# Patient Record
Sex: Female | Born: 1975 | Race: Black or African American | Hispanic: No | Marital: Single | Smoking: Never smoker
Health system: Southern US, Community
[De-identification: ages and names within clinical notes are randomized; demographics above are authoritative.]

## PROBLEM LIST (undated history)

## (undated) DIAGNOSIS — R112 Nausea with vomiting, unspecified: Secondary | ICD-10-CM

## (undated) DIAGNOSIS — T7840XA Allergy, unspecified, initial encounter: Secondary | ICD-10-CM

## (undated) DIAGNOSIS — E079 Disorder of thyroid, unspecified: Secondary | ICD-10-CM

## (undated) DIAGNOSIS — R7303 Prediabetes: Secondary | ICD-10-CM

## (undated) DIAGNOSIS — I10 Essential (primary) hypertension: Secondary | ICD-10-CM

## (undated) DIAGNOSIS — Z9889 Other specified postprocedural states: Secondary | ICD-10-CM

## (undated) HISTORY — DX: Disorder of thyroid, unspecified: E07.9

## (undated) HISTORY — DX: Prediabetes: R73.03

## (undated) HISTORY — DX: Essential (primary) hypertension: I10

## (undated) HISTORY — PX: TONSILLECTOMY: SUR1361

## (undated) HISTORY — PX: BUNIONECTOMY: SHX129

## (undated) HISTORY — DX: Allergy, unspecified, initial encounter: T78.40XA

---

## 2004-10-29 ENCOUNTER — Ambulatory Visit: Payer: Self-pay | Admitting: Gastroenterology

## 2004-11-06 ENCOUNTER — Ambulatory Visit: Payer: Self-pay | Admitting: Gastroenterology

## 2006-02-24 ENCOUNTER — Emergency Department: Payer: Self-pay | Admitting: Emergency Medicine

## 2008-07-20 ENCOUNTER — Emergency Department: Payer: Self-pay | Admitting: Emergency Medicine

## 2008-09-25 IMAGING — CT CT STONE STUDY
1 of 2 series · 16 of 32 positions shown, 20 images · non-contrast
Comparison: none

REASON FOR EXAM: RLQ abdominal pain: please evaluate for stone,
appendicitis
COMMENTS:  LMP: > one month ago

PROCEDURE:     CT  - CT ABDOMEN /PELVIS WO (STONE)  - February 24, 2006  [DATE]
RESULT:
HISTORY: Abdominal pain.

[Series 2: stone · axial · 0.74mm/px · z∈[-528,-118]mm · 16 of 154 slices shown, 20 images]
[im 11/154  soft-tissue]
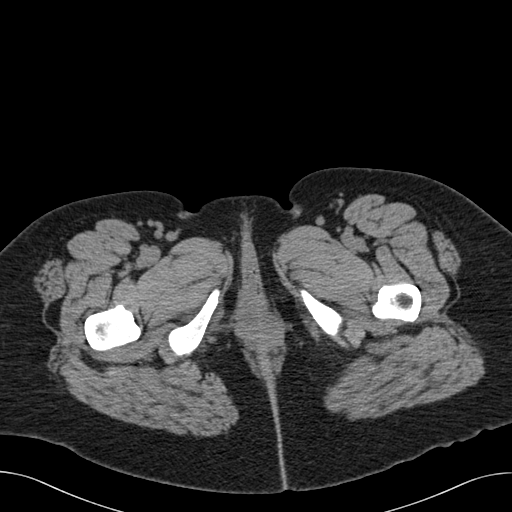
[im 11/154  bone]
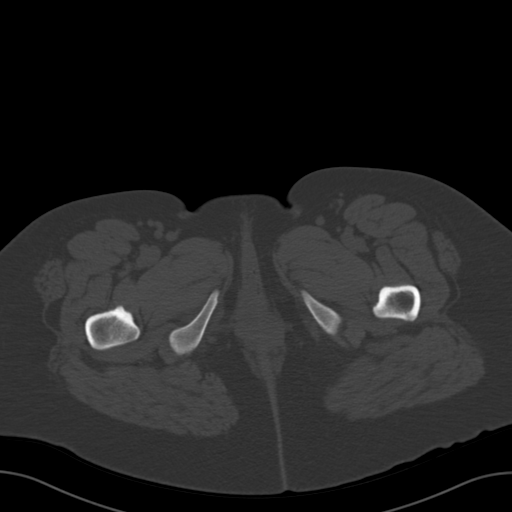
[im 22/154  soft-tissue]
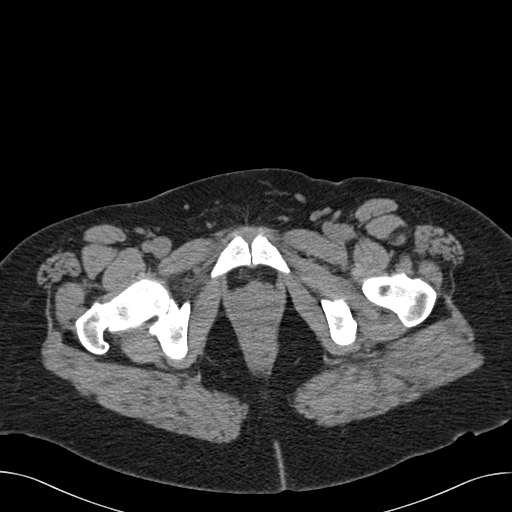
[im 32/154  soft-tissue]
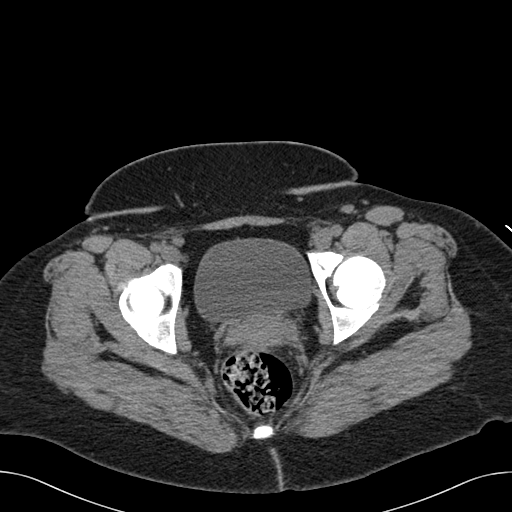
[im 43/154  soft-tissue]
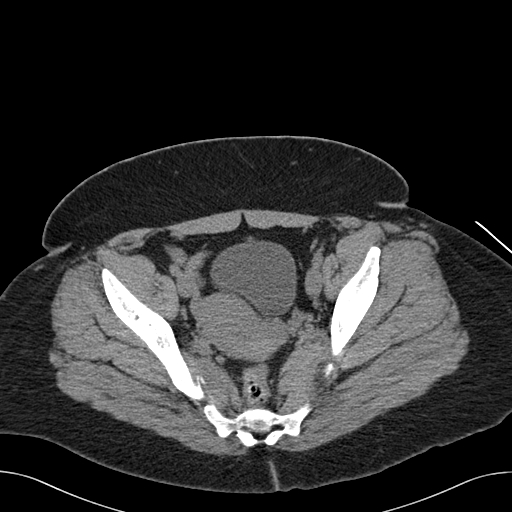
[im 53/154  soft-tissue]
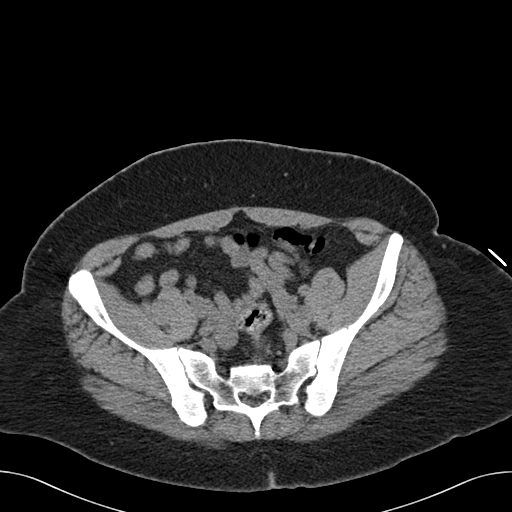
[im 64/154  soft-tissue]
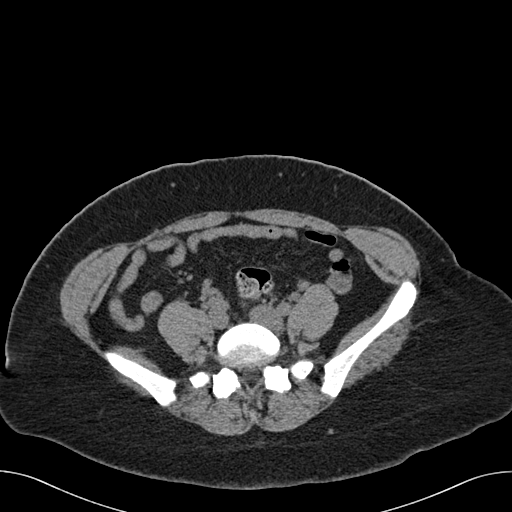
[im 74/154  soft-tissue]
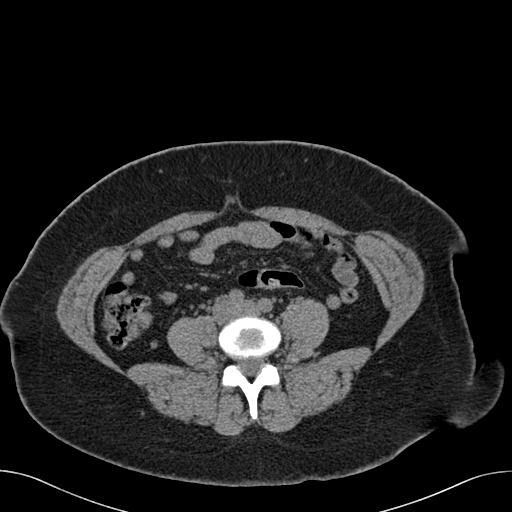
[im 85/154  soft-tissue]
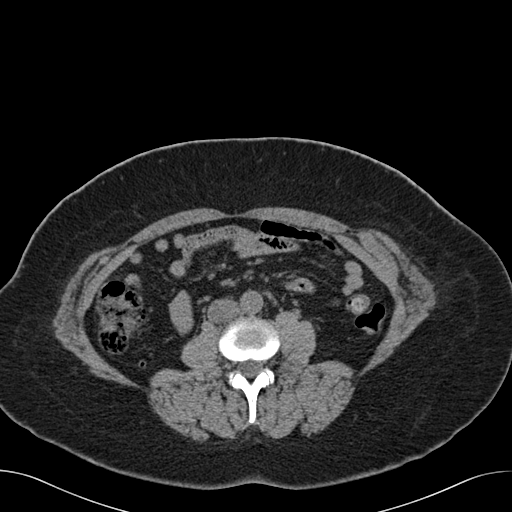
[im 95/154  soft-tissue]
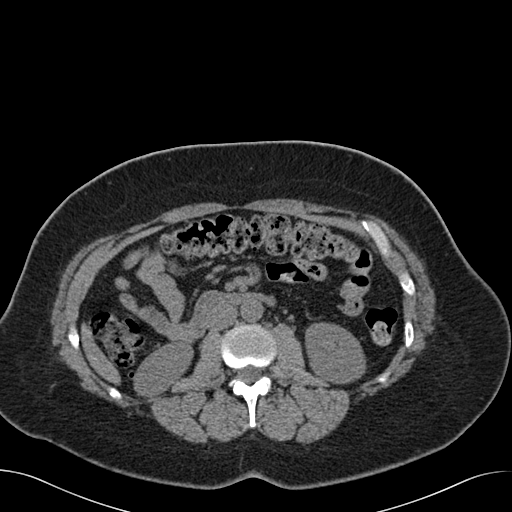
[im 95/154  bone]
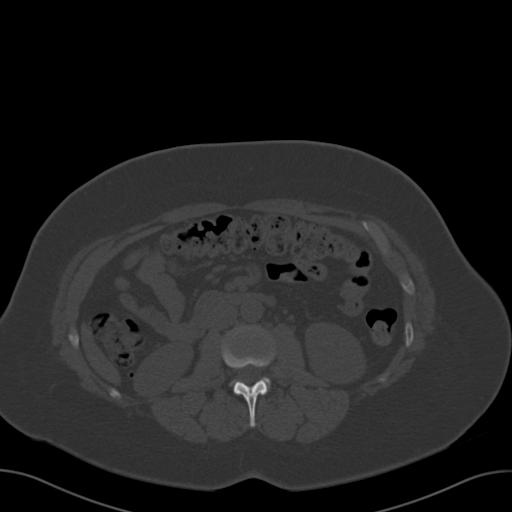
[im 106/154  soft-tissue]
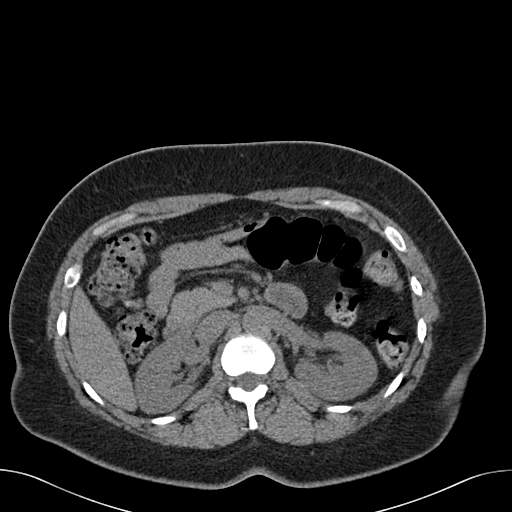
[im 117/154  soft-tissue]
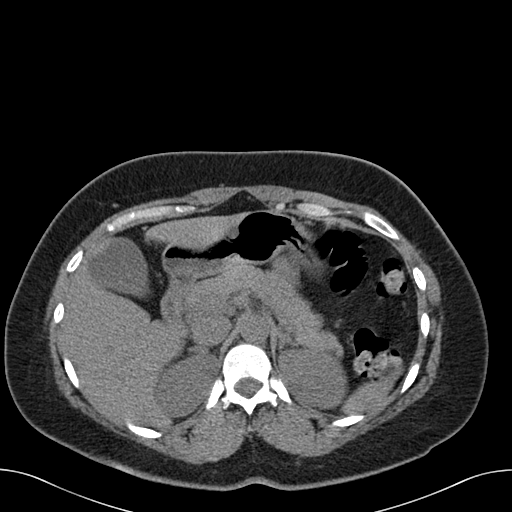
[im 127/154  soft-tissue]
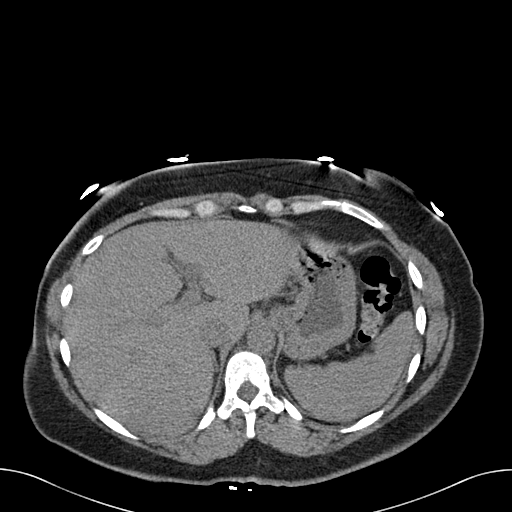
[im 132/154  lung]
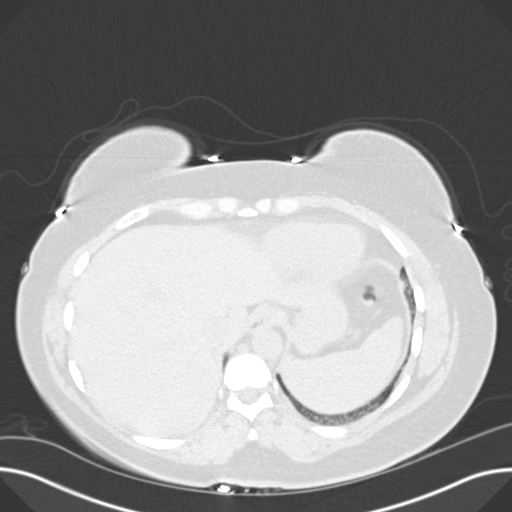
[im 138/154  soft-tissue]
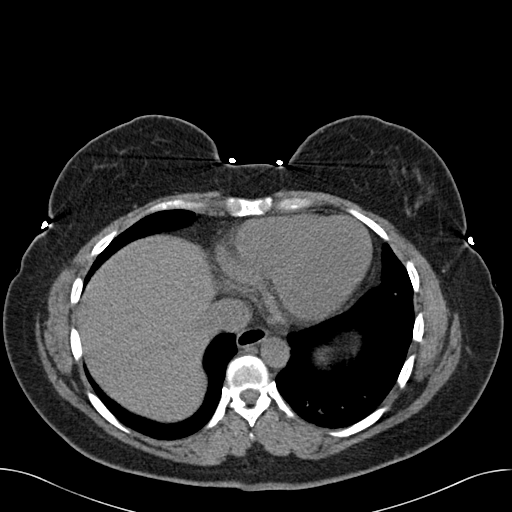
[im 138/154  lung]
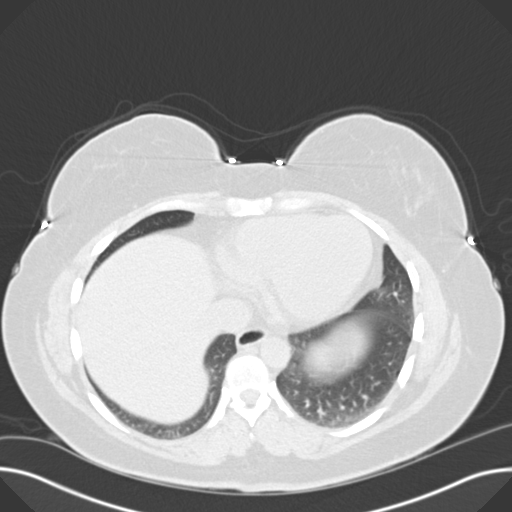
[im 143/154  lung]
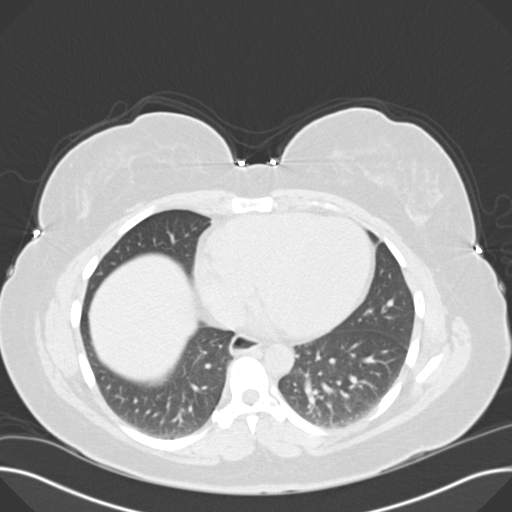
[im 148/154  soft-tissue]
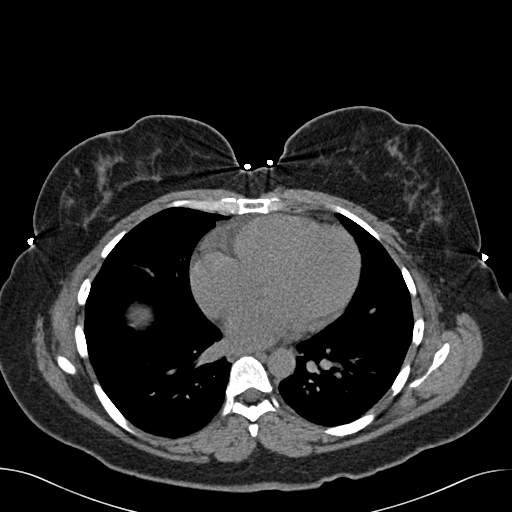
[im 148/154  lung]
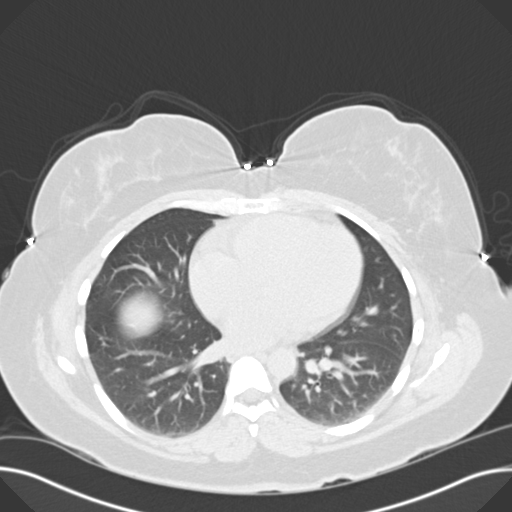

[16 of 32 positions shown; findings below may reference images not displayed]

COMPARISON STUDIES: No recent.

PROCEDURE AND FINDINGS: The liver and spleen are normal. The pancreas is
normal. The gallbladder is non-distended. The adrenals and kidneys are
unremarkable. There is no bowel distention. The appendix is normal. No
inguinal adenopathy is noted.  The lung bases are clear.
IMPRESSION: 1)Nonspecific exam.  Specifically, there is no evidence of hydronephrosis,
ureteral stone or appendicitis. The abdominal aorta is normal.

This report was phoned to the Emergency Room physician at the time of the
study.

## 2012-09-06 ENCOUNTER — Ambulatory Visit: Payer: Self-pay | Admitting: Physician Assistant

## 2013-12-03 HISTORY — PX: LAPAROSCOPIC GASTRIC SLEEVE RESECTION: SHX5895

## 2014-04-15 ENCOUNTER — Encounter: Payer: Self-pay | Admitting: Obstetrics & Gynecology

## 2014-04-15 ENCOUNTER — Ambulatory Visit (INDEPENDENT_AMBULATORY_CARE_PROVIDER_SITE_OTHER): Payer: BLUE CROSS/BLUE SHIELD | Admitting: Obstetrics & Gynecology

## 2014-04-15 VITALS — BP 147/79 | HR 62 | Ht 61.0 in | Wt 175.5 lb

## 2014-04-15 DIAGNOSIS — Z124 Encounter for screening for malignant neoplasm of cervix: Secondary | ICD-10-CM | POA: Diagnosis not present

## 2014-04-15 DIAGNOSIS — Z01419 Encounter for gynecological examination (general) (routine) without abnormal findings: Secondary | ICD-10-CM | POA: Diagnosis not present

## 2014-04-15 DIAGNOSIS — Z1151 Encounter for screening for human papillomavirus (HPV): Secondary | ICD-10-CM | POA: Diagnosis not present

## 2014-04-15 DIAGNOSIS — N946 Dysmenorrhea, unspecified: Secondary | ICD-10-CM | POA: Diagnosis not present

## 2014-04-15 DIAGNOSIS — Z0000 Encounter for general adult medical examination without abnormal findings: Secondary | ICD-10-CM

## 2014-04-15 LAB — CBC
HCT: 40.3 % (ref 36.0–46.0)
Hemoglobin: 12.1 g/dL (ref 12.0–15.0)
MCH: 21.7 pg — AB (ref 26.0–34.0)
MCHC: 30 g/dL (ref 30.0–36.0)
MCV: 72.4 fL — ABNORMAL LOW (ref 78.0–100.0)
Platelets: 245 10*3/uL (ref 150–400)
RBC: 5.57 MIL/uL — ABNORMAL HIGH (ref 3.87–5.11)
RDW: 17.4 % — ABNORMAL HIGH (ref 11.5–15.5)
WBC: 4.6 10*3/uL (ref 4.0–10.5)

## 2014-04-15 NOTE — Progress Notes (Signed)
Here today for gyn physical/ pap smear.  Would like to discuss BC options.

## 2014-04-15 NOTE — Addendum Note (Signed)
Addended by: Tandy GawHINTON, Monet North C on: 04/15/2014 11:06 AM   Modules accepted: Orders

## 2014-04-15 NOTE — Progress Notes (Signed)
Subjective:    Alicia Vincent is a 39 y.o. S AA P1 72(41 yo daughterGilford Rile) female who presents for an annual exam. The patient has no complaints today. She complains a mole that she recently noticed on her left vulva.The patient is not currently sexually active. GYN screening history: last pap: was normal. The patient wears seatbelts: yes. The patient participates in regular exercise: yes. Has the patient ever been transfused or tattooed?: yes. The patient reports that there is not domestic violence in her life.   Menstrual History: OB History    Gravida Para Term Preterm AB TAB SAB Ectopic Multiple Living   1 1              Menarche age: [age]  Patient's last menstrual period was 04/13/2014.    The following portions of the patient's history were reviewed and updated as appropriate: allergies, current medications, past family history, past medical history, past social history, past surgical history and problem list.  Review of Systems A comprehensive review of systems was negative. No mammogram ever, Pap smear is due, Periods are monthly and last about 5 days, painful and heavy. She denies dyspareunia. 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 She declines STI screening. She is interested in a BTL.    Objective:    BP 147/79 mmHg  Pulse 62  Ht 5\' 1"  (1.549 m)  Wt 175 lb 8 oz (79.606 kg)  BMI 33.18 kg/m2  LMP 04/13/2014  General Appearance:    Alert, cooperative, no distress, appears stated age  Head:    Normocephalic, without obvious abnormality, atraumatic  Eyes:    PERRL, conjunctiva/corneas clear, EOM's intact, fundi    benign, both eyes  Ears:    Normal TM's and external ear canals, both ears  Nose:   Nares normal, septum midline, mucosa normal, no drainage    or sinus tenderness  Throat:   Lips, mucosa, and tongue normal; teeth and gums normal  Neck:   Supple, symmetrical, trachea midline, no adenopathy;    thyroid:  no enlargement/tenderness/nodules; no carotid   bruit  or JVD  Back:     Symmetric, no curvature, ROM normal, no CVA tenderness  Lungs:     Clear to auscultation bilaterally, respirations unlabored  Chest Wall:    No tenderness or deformity   Heart:    Regular rate and rhythm, S1 and S2 normal, no murmur, rub   or gallop  Breast Exam:    No tenderness, masses, or nipple abnormality  Abdomen:     Soft, non-tender, bowel sounds active all four quadrants,    no masses, no organomegaly  Genitalia:    Normal female without lesion, discharge or tenderness, skin tag on left labia majora, ULN size, AV, NT, mobile, adnexa not palpable     Extremities:   Extremities normal, atraumatic, no cyanosis or edema  Pulses:   2+ and symmetric all extremities  Skin:   Skin color, texture, turgor normal, no rashes or lesions  Lymph nodes:   Cervical, supraclavicular, and axillary nodes normal  Neurologic:   CNII-XII intact, normal strength, sensation and reflexes    throughout  .    Assessment:    Healthy female exam.   dysmenorrhea   Plan:     Breast self exam technique reviewed and patient encouraged to perform self-exam monthly. Mammogram. Thin prep Pap smear. with cotesting Schedule gyn u/s Consider Essure for contraception

## 2014-04-19 LAB — CYTOLOGY - PAP

## 2014-04-22 ENCOUNTER — Ambulatory Visit (HOSPITAL_COMMUNITY)
Admission: RE | Admit: 2014-04-22 | Discharge: 2014-04-22 | Disposition: A | Discharge status: Home / Self Care | Payer: BLUE CROSS/BLUE SHIELD | Source: Ambulatory Visit | Attending: Obstetrics & Gynecology | Admitting: Obstetrics & Gynecology

## 2014-04-22 DIAGNOSIS — N946 Dysmenorrhea, unspecified: Secondary | ICD-10-CM

## 2014-04-29 ENCOUNTER — Ambulatory Visit: Payer: BLUE CROSS/BLUE SHIELD | Admitting: Obstetrics & Gynecology

## 2014-05-06 ENCOUNTER — Ambulatory Visit (HOSPITAL_COMMUNITY)
Admission: RE | Admit: 2014-05-06 | Discharge: 2014-05-06 | Disposition: A | Discharge status: Home / Self Care | Payer: BLUE CROSS/BLUE SHIELD | Source: Ambulatory Visit | Attending: Obstetrics & Gynecology | Admitting: Obstetrics & Gynecology

## 2014-05-06 ENCOUNTER — Other Ambulatory Visit: Payer: Self-pay | Admitting: Obstetrics & Gynecology

## 2014-05-06 DIAGNOSIS — Z1231 Encounter for screening mammogram for malignant neoplasm of breast: Secondary | ICD-10-CM | POA: Diagnosis not present

## 2014-05-06 DIAGNOSIS — Z0000 Encounter for general adult medical examination without abnormal findings: Secondary | ICD-10-CM

## 2014-05-09 ENCOUNTER — Other Ambulatory Visit: Payer: Self-pay | Admitting: Obstetrics & Gynecology

## 2014-05-09 DIAGNOSIS — R928 Other abnormal and inconclusive findings on diagnostic imaging of breast: Secondary | ICD-10-CM

## 2014-05-10 ENCOUNTER — Encounter: Payer: Self-pay | Admitting: Obstetrics & Gynecology

## 2014-05-10 ENCOUNTER — Ambulatory Visit (INDEPENDENT_AMBULATORY_CARE_PROVIDER_SITE_OTHER): Payer: BLUE CROSS/BLUE SHIELD | Admitting: Obstetrics & Gynecology

## 2014-05-10 VITALS — BP 142/86 | HR 58 | Ht 61.0 in | Wt 170.0 lb

## 2014-05-10 DIAGNOSIS — Z30011 Encounter for initial prescription of contraceptive pills: Secondary | ICD-10-CM

## 2014-05-10 MED ORDER — NORGESTREL-ETHINYL ESTRADIOL 0.3-30 MG-MCG PO TABS
1.0000 | ORAL_TABLET | Freq: Every day | ORAL | Status: DC
Start: 2014-05-10 — End: 2015-10-10

## 2014-05-10 NOTE — Progress Notes (Signed)
   Subjective:    Patient ID: Alicia Vincent, female    DOB: 09/19/1975, 39 y.o.   MRN: [id number]  HPI  39 yo S AA P1 69(41 yo daughter) here today to discuss contraception. She has a new partner and wants reliable contraception. She was interested in the Essure however, after researching it online with me she prefers to use OCPs and then consider Mirena in the future. She has used OCPs without problem in the past.  Review of Systems     Objective:   Physical Exam WNWH NAD Breathing and ambulating normally abd- obese, benign       Assessment & Plan:  Contraception- start generic lo ovral with NMP. USE BACKUP method for a month. RTC 2 months for BP check Encourage weight loss

## 2014-05-12 ENCOUNTER — Other Ambulatory Visit: Payer: Self-pay | Admitting: Obstetrics & Gynecology

## 2014-05-12 ENCOUNTER — Other Ambulatory Visit: Payer: Self-pay

## 2014-05-12 DIAGNOSIS — R928 Other abnormal and inconclusive findings on diagnostic imaging of breast: Secondary | ICD-10-CM

## 2014-05-13 ENCOUNTER — Ambulatory Visit
Admission: RE | Admit: 2014-05-13 | Discharge: 2014-05-13 | Disposition: A | Discharge status: Home / Self Care | Payer: BLUE CROSS/BLUE SHIELD | Source: Ambulatory Visit | Attending: Obstetrics & Gynecology | Admitting: Obstetrics & Gynecology

## 2014-05-13 DIAGNOSIS — R928 Other abnormal and inconclusive findings on diagnostic imaging of breast: Secondary | ICD-10-CM

## 2014-10-14 ENCOUNTER — Other Ambulatory Visit (HOSPITAL_COMMUNITY): Payer: Self-pay | Admitting: Obstetrics & Gynecology

## 2014-10-14 DIAGNOSIS — N6320 Unspecified lump in the left breast, unspecified quadrant: Secondary | ICD-10-CM

## 2014-11-11 ENCOUNTER — Other Ambulatory Visit (HOSPITAL_COMMUNITY): Payer: Self-pay | Admitting: Obstetrics & Gynecology

## 2014-11-11 ENCOUNTER — Ambulatory Visit
Admission: RE | Admit: 2014-11-11 | Discharge: 2014-11-11 | Disposition: A | Discharge status: Home / Self Care | Payer: BLUE CROSS/BLUE SHIELD | Source: Ambulatory Visit | Attending: Obstetrics & Gynecology | Admitting: Obstetrics & Gynecology

## 2014-11-11 DIAGNOSIS — N6320 Unspecified lump in the left breast, unspecified quadrant: Secondary | ICD-10-CM

## 2015-04-06 ENCOUNTER — Other Ambulatory Visit (HOSPITAL_COMMUNITY): Payer: Self-pay | Admitting: Obstetrics & Gynecology

## 2015-04-06 DIAGNOSIS — N6320 Unspecified lump in the left breast, unspecified quadrant: Secondary | ICD-10-CM

## 2015-05-12 ENCOUNTER — Ambulatory Visit
Admission: RE | Admit: 2015-05-12 | Discharge: 2015-05-12 | Disposition: A | Discharge status: Home / Self Care | Payer: BLUE CROSS/BLUE SHIELD | Source: Ambulatory Visit | Attending: Obstetrics & Gynecology | Admitting: Obstetrics & Gynecology

## 2015-05-12 DIAGNOSIS — N6320 Unspecified lump in the left breast, unspecified quadrant: Secondary | ICD-10-CM

## 2015-07-14 ENCOUNTER — Other Ambulatory Visit: Payer: Self-pay | Admitting: Family Medicine

## 2015-07-14 DIAGNOSIS — N644 Mastodynia: Secondary | ICD-10-CM

## 2015-07-17 ENCOUNTER — Ambulatory Visit
Admission: RE | Admit: 2015-07-17 | Discharge: 2015-07-17 | Disposition: A | Discharge status: Home / Self Care | Payer: BLUE CROSS/BLUE SHIELD | Source: Ambulatory Visit | Attending: Family Medicine | Admitting: Family Medicine

## 2015-07-17 DIAGNOSIS — N644 Mastodynia: Secondary | ICD-10-CM

## 2015-10-10 ENCOUNTER — Ambulatory Visit (INDEPENDENT_AMBULATORY_CARE_PROVIDER_SITE_OTHER): Payer: BLUE CROSS/BLUE SHIELD | Admitting: Obstetrics & Gynecology

## 2015-10-10 ENCOUNTER — Encounter: Payer: Self-pay | Admitting: Obstetrics & Gynecology

## 2015-10-10 VITALS — BP 133/79 | HR 69 | Wt 141.0 lb

## 2015-10-10 DIAGNOSIS — N946 Dysmenorrhea, unspecified: Secondary | ICD-10-CM | POA: Diagnosis not present

## 2015-10-10 DIAGNOSIS — Z23 Encounter for immunization: Secondary | ICD-10-CM

## 2015-10-10 DIAGNOSIS — Z0000 Encounter for general adult medical examination without abnormal findings: Secondary | ICD-10-CM

## 2015-10-10 DIAGNOSIS — Z3042 Encounter for surveillance of injectable contraceptive: Secondary | ICD-10-CM

## 2015-10-10 DIAGNOSIS — N920 Excessive and frequent menstruation with regular cycle: Secondary | ICD-10-CM

## 2015-10-10 MED ORDER — MEDROXYPROGESTERONE ACETATE 150 MG/ML IM SUSP
150.0000 mg | Freq: Once | INTRAMUSCULAR | Status: AC
  Administered 2015-10-10: 150 mg via INTRAMUSCULAR

## 2015-10-10 NOTE — Progress Notes (Signed)
   Subjective:    Patient ID: Alicia RileMinnie D Vincent, female    DOB: 05/24/1975, 40 y.o.   MRN: [id number]  HPI 40 yo S AA 58P1 yo here to discuss her desire to have no periods. She would like to start depo provera. She was prescribed OCPs last year for contraception but is no longer sexually active and she quit taking them after only 1 month. Her periods are heavy and painful. She used depo provera for about a year in her 30s and did not have any periods although she did gain weight.   Review of Systems She had the gastric sleeve surgery about a year ago. She has lost 90# and is still losing a little. Pap normal 4/16 Mammogram 5/17    Objective:   Physical Exam [id number] Breathing, conversing, and ambulating normally Abd- benign     Assessment & Plan:  Desire for amenorrhea/wants to restart depo provera I have cautioned her that this may cause weight gain I have also offered to order u/s to look for cause of her dymenorrhea/heavy periods. Although she initially declined this, she has now agreed. RTC 12 weeks/prn sooner

## 2015-10-11 LAB — CBC
HEMATOCRIT: 42 % (ref 35.0–45.0)
Hemoglobin: 12.7 g/dL (ref 11.7–15.5)
MCH: 22.4 pg — ABNORMAL LOW (ref 27.0–33.0)
MCHC: 30.2 g/dL — ABNORMAL LOW (ref 32.0–36.0)
MCV: 73.9 fL — ABNORMAL LOW (ref 80.0–100.0)
MPV: 11.1 fL (ref 7.5–12.5)
Platelets: 227 10*3/uL (ref 140–400)
RBC: 5.68 MIL/uL — AB (ref 3.80–5.10)
RDW: 16 % — AB (ref 11.0–15.0)
WBC: 5.2 10*3/uL (ref 3.8–10.8)

## 2015-10-20 ENCOUNTER — Ambulatory Visit: Payer: BLUE CROSS/BLUE SHIELD

## 2015-12-26 ENCOUNTER — Ambulatory Visit: Payer: BLUE CROSS/BLUE SHIELD

## 2015-12-29 ENCOUNTER — Ambulatory Visit (INDEPENDENT_AMBULATORY_CARE_PROVIDER_SITE_OTHER): Payer: BLUE CROSS/BLUE SHIELD | Admitting: *Deleted

## 2015-12-29 DIAGNOSIS — Z3042 Encounter for surveillance of injectable contraceptive: Secondary | ICD-10-CM

## 2015-12-29 DIAGNOSIS — N946 Dysmenorrhea, unspecified: Secondary | ICD-10-CM

## 2015-12-29 MED ORDER — MEDROXYPROGESTERONE ACETATE 150 MG/ML IM SUSP
150.0000 mg | INTRAMUSCULAR | Status: DC
  Administered 2015-12-29 – 2016-07-09 (×3): 150 mg via INTRAMUSCULAR

## 2015-12-29 NOTE — Progress Notes (Signed)
Pt here today for Depo Provera 150mg , last injection given on 10/10/15.  Depo Provera 150mg  given.

## 2016-03-15 ENCOUNTER — Ambulatory Visit (INDEPENDENT_AMBULATORY_CARE_PROVIDER_SITE_OTHER): Payer: BLUE CROSS/BLUE SHIELD | Admitting: *Deleted

## 2016-03-15 DIAGNOSIS — Z3042 Encounter for surveillance of injectable contraceptive: Secondary | ICD-10-CM | POA: Diagnosis not present

## 2016-03-15 DIAGNOSIS — N946 Dysmenorrhea, unspecified: Secondary | ICD-10-CM | POA: Diagnosis not present

## 2016-03-15 MED ORDER — MEDROXYPROGESTERONE ACETATE 150 MG/ML IM SUSP: 150.0000 mg | INTRAMUSCULAR | 3 refills | Status: DC

## 2016-03-15 NOTE — Progress Notes (Signed)
Pt is here today for Depo Provera 150mg  injection to help with dysmenorrhea.  Depo Provera given and rx sent to pharmacy for pt to supply at next visit.  Pt acknowledged instructions.

## 2016-06-11 ENCOUNTER — Ambulatory Visit
Admission: EM | Admit: 2016-06-11 | Discharge: 2016-06-11 | Disposition: A | Discharge status: Home / Self Care | Payer: 59 | Attending: Family Medicine | Admitting: Family Medicine

## 2016-06-11 ENCOUNTER — Ambulatory Visit: Payer: BLUE CROSS/BLUE SHIELD

## 2016-06-11 DIAGNOSIS — M26621 Arthralgia of right temporomandibular joint: Secondary | ICD-10-CM

## 2016-06-11 DIAGNOSIS — R609 Edema, unspecified: Secondary | ICD-10-CM | POA: Diagnosis not present

## 2016-06-11 DIAGNOSIS — I10 Essential (primary) hypertension: Secondary | ICD-10-CM

## 2016-06-11 DIAGNOSIS — Z8261 Family history of arthritis: Secondary | ICD-10-CM

## 2016-06-11 DIAGNOSIS — M79641 Pain in right hand: Secondary | ICD-10-CM | POA: Diagnosis not present

## 2016-06-11 MED ORDER — HYDROCHLOROTHIAZIDE 25 MG PO TABS: 25.0000 mg | ORAL_TABLET | Freq: Every day | ORAL | Status: DC

## 2016-06-11 NOTE — ED Triage Notes (Signed)
Patient complains of right hand swelling worse in the morning. Patient also reports bilateral feet swelling. Patient reports bilateral jaw pain that is worse with chewing.

## 2016-06-11 NOTE — Discharge Instructions (Signed)
For today Wednesday and Friday recommend taking extra hydrochlorothiazide pill also please take with or issues or eating a banana on those days as well

## 2016-06-11 NOTE — ED Provider Notes (Signed)
[facility]    CSN: [id number] Arrival date & time: 06/11/16  0818     History   Chief Complaint Chief Complaint  Patient presents with  . Hand Pain    right  . Leg Swelling    Bilateral  . Jaw Pain    Bilateral    HPI Alicia Vincent is a 41 y.o. female.   Patient's here because several things #1 was hand pain started yesterday which woke up she also notes swelling in both legs. She has had some right jaw pain as well. Swelling in the leg though was worse than the left foot than the right. The hand pain also is in both hands was worse in the right versus the left. If further discuss with patient this is strong history of rheumatoid arthritis in the family. According to her she has several family members brother mother and other close family members who've had rheumatoid arthritis. She doesn't been diagnosed rheumatoid arthritis. She was concerned and came in to be seen and evaluated. She also has history hypertension she is taking hydrochlorothiazide prescribed by her doctor but she states that her doctors at the area. She only has a few more pills left. She also reports being on hormonal therapy for birth control.   The history is provided by the patient. No language interpreter was used.  Hand Pain  This is a new problem. The current episode started yesterday. The problem has not changed since onset.Pertinent negatives include no chest pain, no abdominal pain, no headaches and no shortness of breath. Nothing aggravates the symptoms. She has tried nothing for the symptoms. The treatment provided no relief.    Past Medical History:  Diagnosis Date  . Allergy   . Hypertension   . Pre-diabetes   . Thyroid disease     There are no active problems to display for this patient.   Past Surgical History:  Procedure Laterality Date  . BUNIONECTOMY    . LAPAROSCOPIC GASTRIC SLEEVE RESECTION  12/03/13    OB History    Gravida Para Term Preterm AB Living   1 1            SAB TAB Ectopic Multiple Live Births                   Home Medications    Prior to Admission medications   Medication Sig Start Date End Date Taking? Authorizing Provider  docusate sodium (COLACE) 100 MG capsule Take 100 mg by mouth. 12/21/13  Yes [provider]  EPINEPHrine 0.3 mg/0.3 mL IJ SOAJ injection Frequency:PHARMDIR   Dosage:0.0     Instructions:  Note:Inject into lateral thigh according to package instructions as needed for severe allergic reactions, then go immediately to the nearest Emergency Department Dose: 0.3MG /0.3 11/29/11  Yes [provider]  hydrochlorothiazide (HYDRODIURIL) 25 MG tablet Take 25 mg by mouth daily.   Yes [provider]  medroxyPROGESTERone (DEPO-PROVERA) 150 MG/ML injection Inject 1 mL (150 mg total) into the muscle every 3 (three) months. 03/15/16  Yes Dove, Myra C, MD  fluticasone (FLONASE) 50 MCG/ACT nasal spray 2 sprays in each nostril daily when needed. 05/17/10   [provider]  hydrochlorothiazide (HYDRODIURIL) 25 MG tablet Take 1 tablet (25 mg total) by mouth daily. 06/11/16   Hassan RowanWade, Rik Wadel, MD  omeprazole (PRILOSEC) 10 MG capsule Take 10 mg by mouth daily.    [provider]    Family History Family History  Problem Relation Age  of Onset  . Hypertension Mother   . Diabetes Mother   . Thyroid disease Mother   . Heart disease Mother   . Hypertension Father     Social History Social History  Substance Use Topics  . Smoking status: Never Smoker  . Smokeless tobacco: Never Used  . Alcohol use No     Allergies   Shellfish-derived products   Review of Systems Review of Systems  Constitutional: Negative for activity change and appetite change.  Respiratory: Negative for shortness of breath.   Cardiovascular: Negative for chest pain.  Gastrointestinal: Negative for abdominal pain.  Musculoskeletal: Positive for joint swelling and myalgias.  Skin: Negative for color change, pallor and  rash.  Neurological: Negative for headaches.  All other systems reviewed and are negative.    Physical Exam Triage Vital Signs ED Triage Vitals  Enc Vitals Group     BP 06/11/16 0846 (!) 161/81     Pulse Rate 06/11/16 0846 63     Resp 06/11/16 0846 18     Temp 06/11/16 0846 98.4 F (36.9 C)     Temp Source 06/11/16 0846 Oral     SpO2 06/11/16 0846 100 %     Weight 06/11/16 0844 138 lb (62.6 kg)     Height 06/11/16 0844 5' (1.524 m)     Head Circumference --      Peak Flow --      Pain Score 06/11/16 0844 7     Pain Loc --      Pain Edu? --      Excl. in GC? --    No data found.   Updated Vital Signs BP (!) 161/81 (BP Location: Left Arm)   Pulse 63   Temp 98.4 F (36.9 C) (Oral)   Resp 18   Ht 5' (1.524 m)   Wt 138 lb (62.6 kg)   SpO2 100%   BMI 26.95 kg/m   Visual Acuity Right Eye Distance:   Left Eye Distance:   Bilateral Distance:    Right Eye Near:   Left Eye Near:    Bilateral Near:     Physical Exam  Constitutional: She is oriented to person, place, and time. She appears well-developed and well-nourished.  HENT:  Head: Normocephalic and atraumatic. Head is without contusion. Hair is normal.    Right Ear: External ear normal.  Tenderness over the right jaw which could be early TMJ syndrome her right hand is mildly swollen and tender to palpation no arthritic change are present  Eyes: Pupils are equal, round, and reactive to light.  Pulmonary/Chest: Effort normal.  Musculoskeletal: She exhibits edema.       Right hand: She exhibits decreased range of motion and tenderness.       Hands:      Right foot: There is swelling.       Left foot: There is tenderness and swelling.       Feet:  Neurological: She is alert and oriented to person, place, and time.  Skin: Skin is warm and dry.  Psychiatric: She has a normal mood and affect.  Vitals reviewed.    UC Treatments / Results  Labs (all labs ordered are listed, but only abnormal results are  displayed) Labs Reviewed - No data to display  EKG  EKG Interpretation None       Radiology No results found.  Procedures Procedures (including critical care time)  Medications Ordered in UC Medications - No data to display   Initial  Impression / Assessment and Plan / UC Course  I have reviewed the triage vital signs and the nursing notes.  Pertinent labs & imaging results that were available during my care of the patient were reviewed by me and considered in my medical decision making (see chart for details).    Explained patient that I really think that she is on the need to follow-up for PCP to get rheumatoid work done sedimentation rate and be followed. I don't think there is evidence of arthritis now but this could definitely felt family history. I am unable to prescribe an anti-inflammatory like Mobic she normally would do since she's had gastric bypass surgery. I would recommend Tylenol for discomfort for the increased swelling on Tuesday Wednesday and Friday this week taking extra hydrochlorothiazide pill and drink or sutures or your banana when she takes the extra pill. I will give her a 30 day prescription with one refill until she can find a PCP here and ID give her Doctor Plonk name to call for an appointment. Work note given for today as well.   Final Clinical Impressions(s) / UC Diagnoses   Final diagnoses:  Peripheral edema  Right hand pain  TMJ tenderness, right  Family history of rheumatoid arthritis  Essential hypertension    New Prescriptions Discharge Medication List as of 06/11/2016  9:30 AM    START taking these medications   Details  !! hydrochlorothiazide (HYDRODIURIL) 25 MG tablet Take 1 tablet (25 mg total) by mouth daily., Starting Tue 06/11/2016, Normal     !! - Potential duplicate medications found. Please discuss with provider.       Note: This dictation was prepared with Dragon dictation along with smaller phrase technology. Any  transcriptional errors that result from this process are unintentional.   Hassan Rowan, MD 06/11/16 1004

## 2016-07-09 ENCOUNTER — Ambulatory Visit (INDEPENDENT_AMBULATORY_CARE_PROVIDER_SITE_OTHER): Payer: 59 | Admitting: *Deleted

## 2016-07-09 DIAGNOSIS — Z3042 Encounter for surveillance of injectable contraceptive: Secondary | ICD-10-CM | POA: Diagnosis not present

## 2016-07-09 DIAGNOSIS — N946 Dysmenorrhea, unspecified: Secondary | ICD-10-CM

## 2016-07-09 NOTE — Progress Notes (Signed)
Pt is here today for Depo Provera 150 mg injection to help with dysmenorrhea.  Last injection was given on 03-15-16.  Pt is currently practicing abstinence and has not been sexually active in over 6 months. Depo Provera 150mg  given.

## 2016-09-24 ENCOUNTER — Ambulatory Visit: Payer: BLUE CROSS/BLUE SHIELD

## 2016-11-21 IMAGING — US US TRANSVAGINAL NON-OB
1 series · 14 of 25 positions shown · non-contrast
Comparison: None

CLINICAL DATA: Dysmenorrhea



[Series 1: us pelvis complete · 14 of 62 slices shown]
[im 1/62]
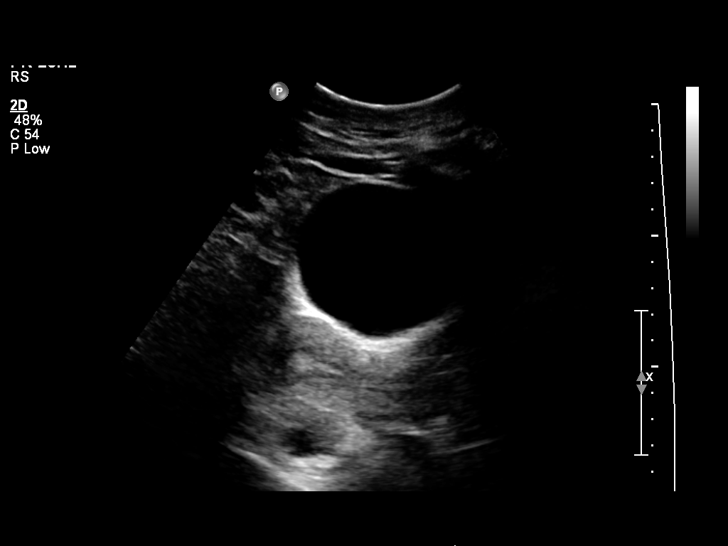
[im 6/62]
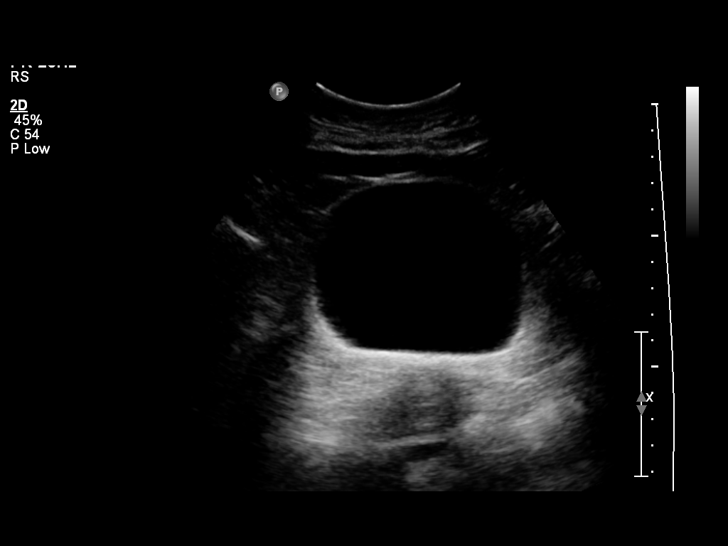
[im 11/62]
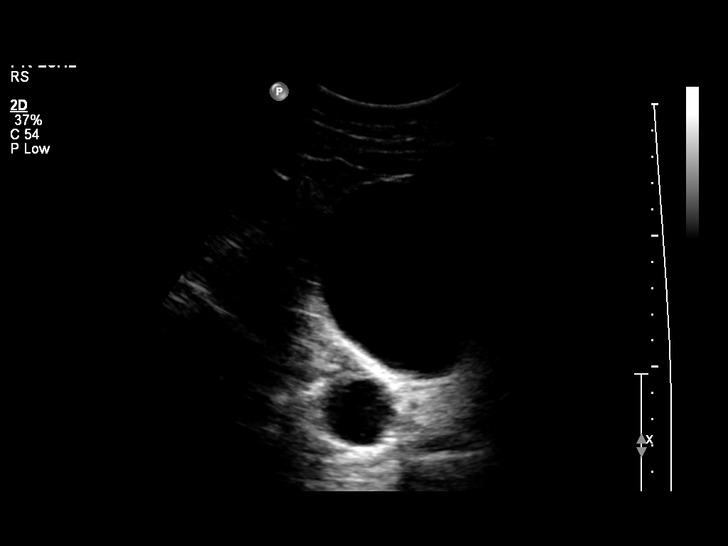
[im 16/62]
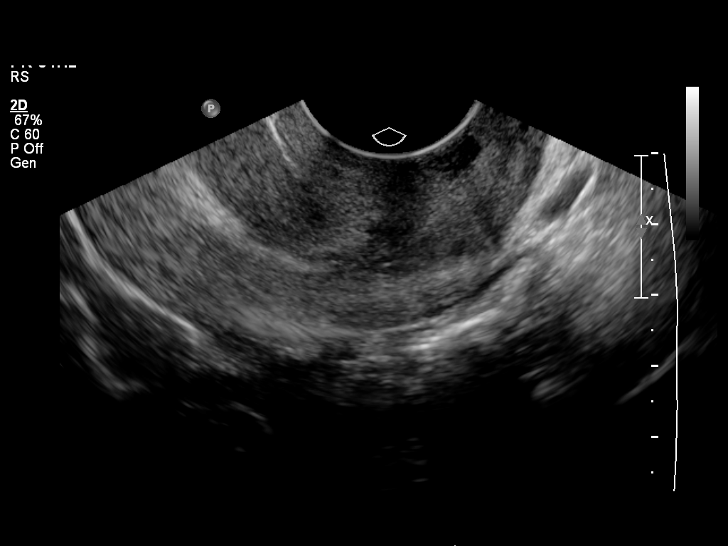
[im 21/62]
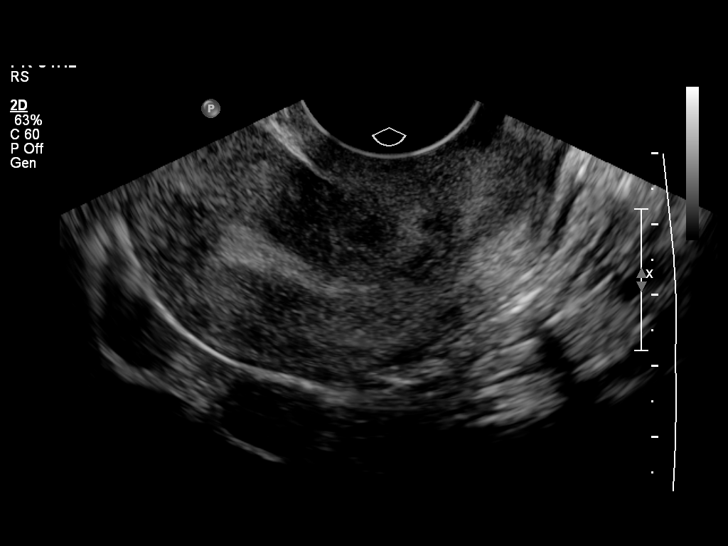
[im 23/62]
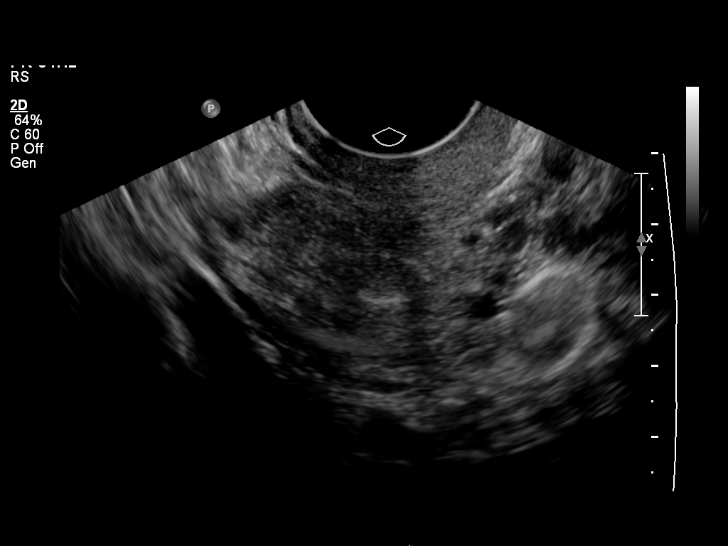
[im 28/62]
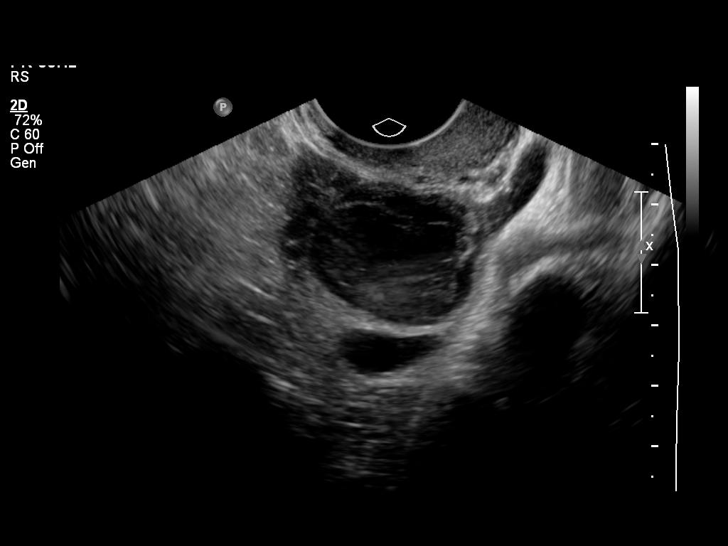
[im 34/62]
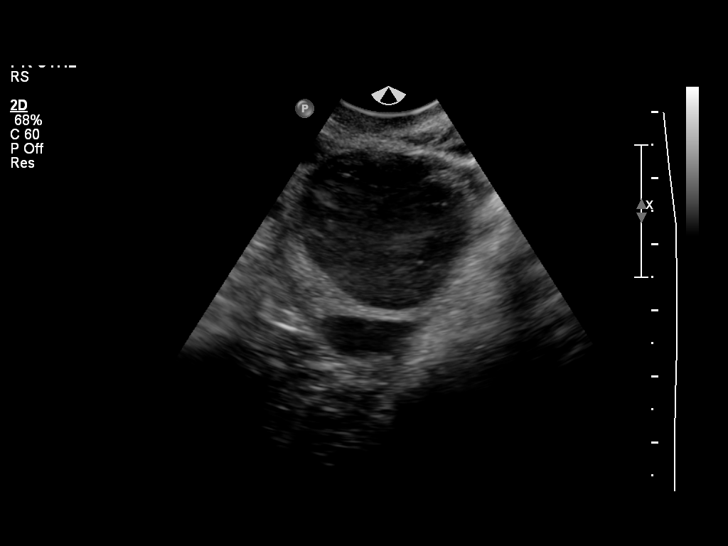
[im 39/62]
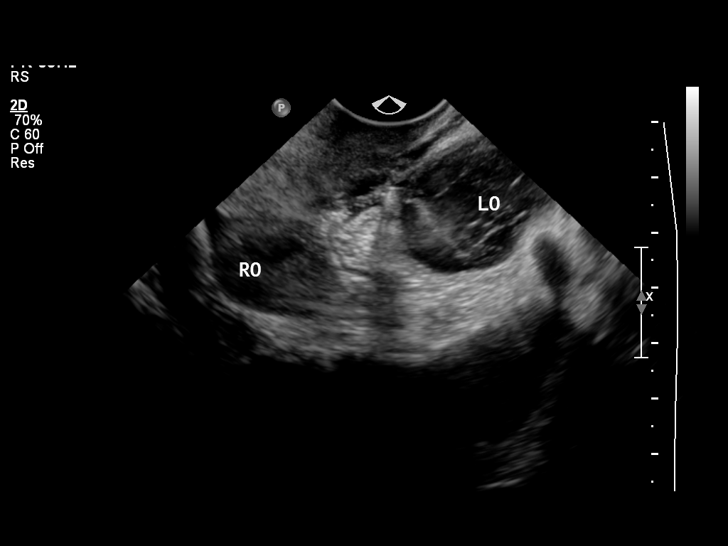
[im 41/62]
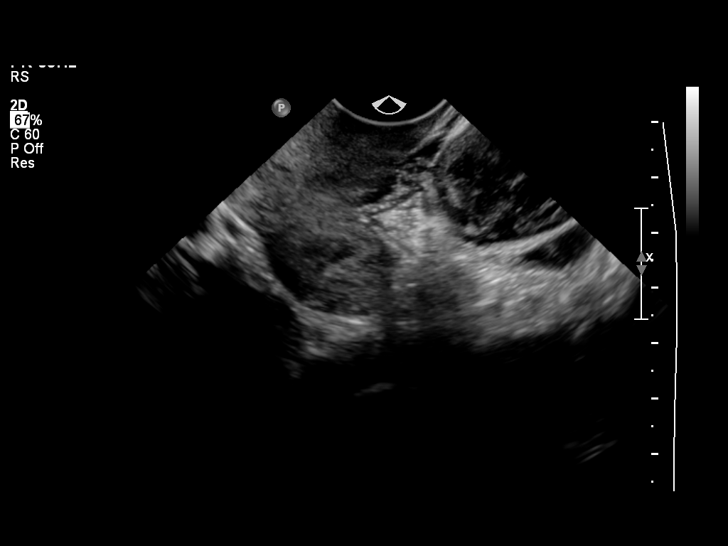
[im 46/62]
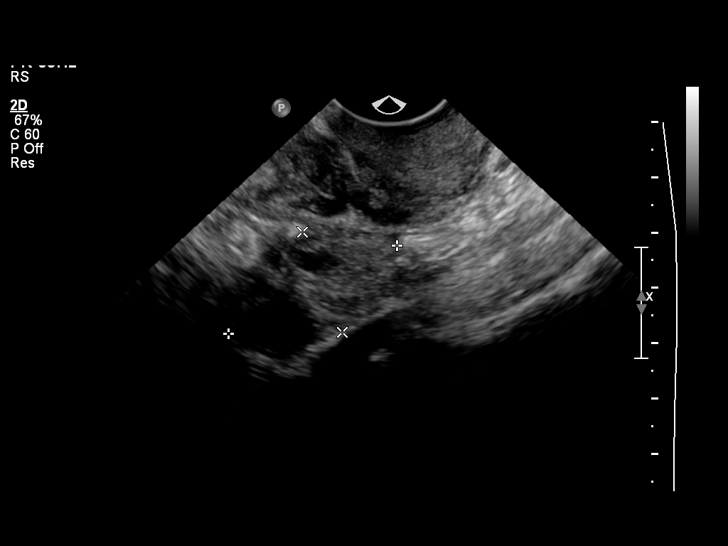
[im 51/62]
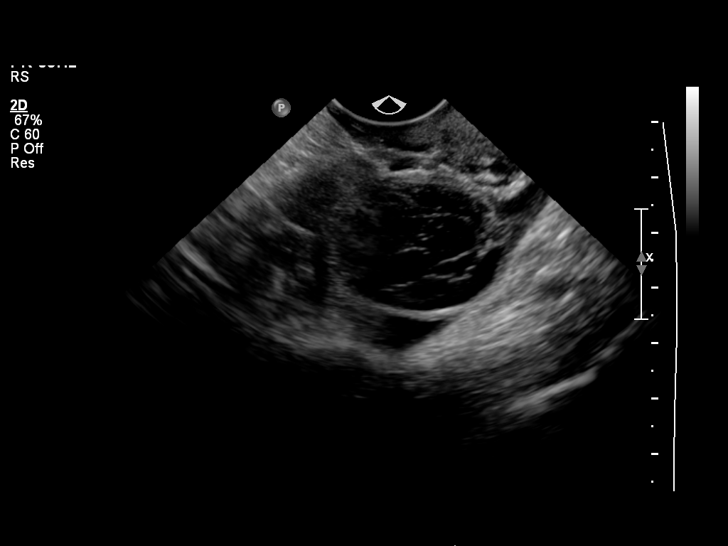
[im 56/62]
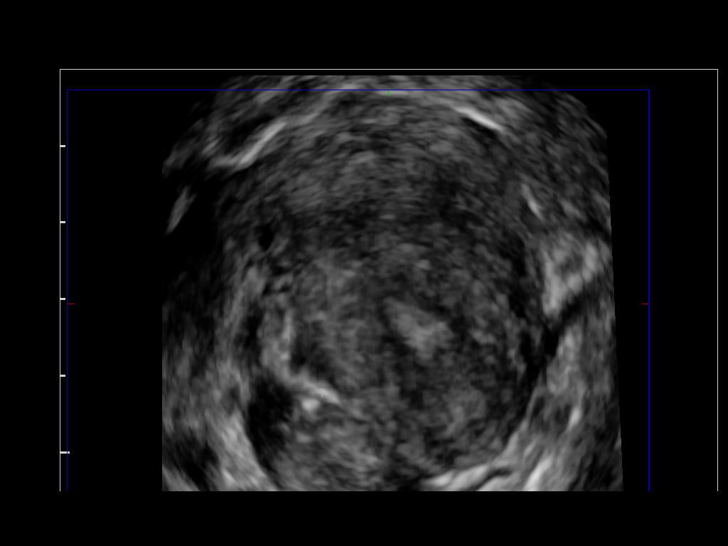
[im 62/62]
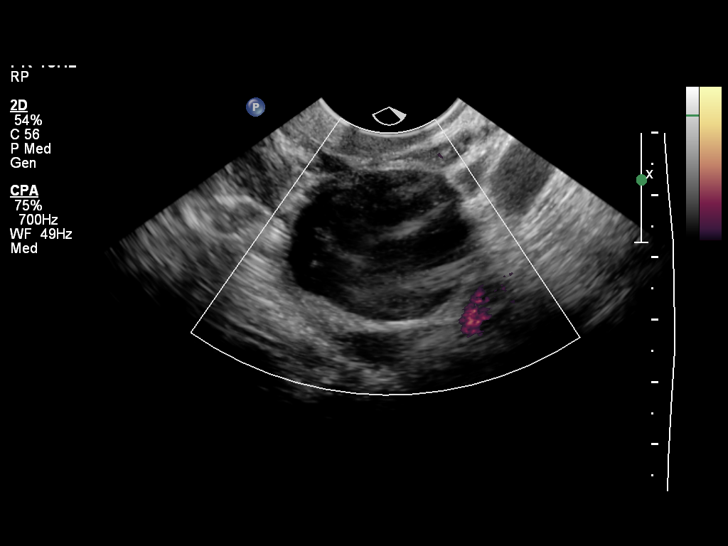

[14 of 25 positions shown; findings below may reference images not displayed]

FINDINGS: Uterus

Measurements: 7.2 x 3.1 x 4.0. No fibroids or other mass visualized.

Endometrium

Thickness: 5 mm.  No focal abnormality visualized.

Right ovary

Measurements: 3.4 x 2.0 x 2.5 cm. Normal appearance/no adnexal mass.

Left ovary

Measurements: 3.3 x 2.4 x 2.9 cm. 3.0 x 2.3 x 2.8 cm hemorrhagic
cyst.

Other findings

Small volume pelvic ascites.
IMPRESSION: 3.0 cm hemorrhagic left ovarian cyst. Consider follow-up pelvic
ultrasound in 6-12 weeks as clinically warranted.

Otherwise negative pelvic ultrasound.

## 2018-10-12 NOTE — H&P (Signed)
Alicia Vincent is a 43 y.o. female here for TVh and bilateral salpingectomy  .  Alicia Vincent a 43 y.o.femalehere for Rf by Dr Rex Dancel-irregular menses .pt was on Depo provera now for 5 yrs . PCP concerned about prolonged Depo Use and bone density issues. Prior to Depo provera , menses where heavy and painful . Pt states she has had intermittent left pelvic pain . + constipation . Not sexually active  G1P1 svd . Pt does not want to continue hormonal therapy even after it been offered  S/p L/S gastrectomy  embx neg  Pap neg  Past Medical History:  has a past medical history of Arthritis, Depression, Diabetes mellitus type 2, uncomplicated (CMS-HCC), GERD (gastroesophageal reflux disease), Graves disease (2008), Heart disease, Hypertension, and Menorrhagia with irregular cycle.  Past Surgical History:  has a past surgical history that includes Colonoscopy (10/29/2004); egd (10/29/2004); Bunionette excision; laparoscopic gastrectomy; Cholecystectomy; and Tonsillectomy. Family History: family history includes Diabetes in her mother; High blood pressure (Hypertension) in her father and mother; Myocardial Infarction (Heart attack) in her father; Pacemaker in her mother. Social History:  reports that she has never smoked. She has never used smokeless tobacco. She reports that she does not drink alcohol or use drugs. OB/GYN History:  OB History    Gravida  1   Para  1   Term      Preterm      AB      Living  1     SAB      TAB      Ectopic      Molar      Multiple      Live Births  1          Allergies: is allergic to shellfish containing products; ibuprofen; and oxycodone. Medications:  Current Outpatient Medications:  .  amitriptyline (ELAVIL) 50 MG tablet, Take 1 tablet (50 mg total) by mouth nightly, Disp: 30 tablet, Rfl: 11 .  cetirizine (ZYRTEC) 10 mg capsule, 1 tab by mouth daily as needed, Disp: , Rfl:  .  cyanocobalamin (VITAMIN B12) 500 MCG tablet, Take 1  tablet (500 mcg total) by mouth once daily, Disp: 30 tablet, Rfl: 11 .  diclofenac (VOLTAREN) 50 MG EC tablet, Take 1 tablet (50 mg total) by mouth 2 (two) times daily with meals, Disp: 60 tablet, Rfl: 1 .  EPINEPHrine (EPIPEN) 0.3 mg/0.3 mL pen injector, Frequency:PHARMDIR   Dosage:0.0     Instructions:  Note:Inject into lateral thigh according to package instructions as needed for severe allergic reactions, then go immediately to the nearest Emergency Department Dose: 0.3MG /0.3, Disp: , Rfl:  .  fluticasone (FLONASE) 50 mcg/actuation nasal spray, 2 sprays in each nostril daily when needed., Disp: , Rfl: 5 .  gabapentin (NEURONTIN) 100 MG capsule, Take 100 mg by mouth 3 (three) times daily, Disp: , Rfl:  .  hydrochlorothiazide (HYDRODIURIL) 25 MG tablet, 0.5 tab by mouth daily, Disp: , Rfl:  .  hydrOXYzine (ATARAX) 25 MG tablet, Take 1 tablet (25 mg total) by mouth 3 (three) times daily as needed for Itching, Disp: 30 tablet, Rfl: 0 .  methotrexate 25 mg/mL injection, Inject 0.6 mLs (15 mg total) subcutaneously once a week (Patient taking differently: Inject 20 mg subcutaneously once a week 0.8 once a week), Disp: 10 mL, Rfl: 1 .  ORENCIA 125 mg/mL injection syringe, INJECT 1 ML (125 MG) UNDER THE SKIN EVERY 7 DAYS, Disp: 12 mL, Rfl: 1 .  polyethylene glycol (MIRALAX) powder,  17 gram by mouth daily, Disp: , Rfl: 1 .  traMADol (ULTRAM) 50 mg tablet, Take 50 mg by mouth every 6 (six) hours as needed, Disp: , Rfl:  .  folic acid (FOLVITE) 1 MG tablet, Take one daily except NONE on WEDNESDAYS (methotrexate days)., Disp: , Rfl:  .  predniSONE (DELTASONE) 10 MG tablet, Patient has been taking 20 mg a day. , Disp: , Rfl:   Review of Systems: General:                      No fatigue or weight loss Eyes:                           No vision changes Ears:                            No hearing difficulty Respiratory:                No cough or shortness of breath Pulmonary:                  No asthma or  shortness of breath Cardiovascular:           No chest pain, palpitations, dyspnea on exertion Gastrointestinal:          No abdominal bloating, chronic diarrhea, constipations, masses, pain or hematochezia Genitourinary:             No hematuria, dysuria, abnormal vaginal discharge, pelvic pain, Menometrorrhagia Lymphatic:                   No swollen lymph nodes Musculoskeletal:         No muscle weakness Neurologic:                  No extremity weakness, syncope, seizure disorder Psychiatric:                  No history of depression, delusions or suicidal/homicidal ideation    Exam:      Vitals:   10/14/18 1009  BP: 168/88  Pulse: 61    Body mass index is 32.31 kg/m.  WDWN white/  female in NAD   Lungs: CTA  CV : RRR without murmur    Neck:  no thyromegaly Abdomen: soft , no mass, normal active bowel sounds,  non-tender, no rebound tenderness Pelvic: tanner stage 5 ,  External genitalia: vulva /labia no lesions Urethra: no prolapse Vagina: normal physiologic d/c, adequate room for hysterectomy  Cervix: no lesions, no cervical motion tenderness   Uterus: normal size shape and contour, non-tender Adnexa: no mass,  non-tender   Rectovaginal: U/S: Saline [place]    Ut wnl  Endometrium=4.25 mm   Rt ov wnl  Lt simple ov cyst=1.25 cm Saline infusion sonohysterography: betadine prep to the cervix followed by placement of the HSG catheter into the endometrial canal . Sterile H2O is injected while performing a transvaginal u/s . Findings:no endometrial pathology   Impression:   The encounter diagnosis was Menorrhagia with irregular cycle.    Plan:  I have spoken with the patient regarding treatment options including expectant management, hormonal options, or surgical intervention. After a full discussion the pt elects to proceed with TVH and bilateral salpingectomy   Benefits and risks to surgery: The proposed benefit of the surgery has been  discussed with the patient. The possible risks include, but  are not limited to: organ injury to the bowel , bladder, ureters, and major blood vessels and nerves. There is a possibility of additional surgeries resulting from these injuries. There is also the risk of blood transfusion and the need to receive blood products during or after the procedure which may rarely lead to HIV or Hepatitis C infection. There is a risk of developing a deep venous thrombosis or a pulmonary embolism . There is the possibility of wound infection and also anesthetic complications, even the rare possibility of death. The patient understands these risks and wishes to proceed. All questions have been answered and the consent has been signed.     Return for preop.  Vilma PraderHOMAS JANSE Keaunna Skipper, MD

## 2018-10-22 ENCOUNTER — Other Ambulatory Visit: Payer: Self-pay

## 2018-10-22 ENCOUNTER — Encounter
Admission: RE | Admit: 2018-10-22 | Discharge: 2018-10-22 | Disposition: A | Discharge status: Home / Self Care | Payer: 59 | Source: Ambulatory Visit | Attending: Obstetrics and Gynecology | Admitting: Obstetrics and Gynecology

## 2018-10-22 DIAGNOSIS — N920 Excessive and frequent menstruation with regular cycle: Secondary | ICD-10-CM | POA: Insufficient documentation

## 2018-10-22 DIAGNOSIS — Z01818 Encounter for other preprocedural examination: Secondary | ICD-10-CM | POA: Insufficient documentation

## 2018-10-22 DIAGNOSIS — I10 Essential (primary) hypertension: Secondary | ICD-10-CM | POA: Diagnosis not present

## 2018-10-22 DIAGNOSIS — I498 Other specified cardiac arrhythmias: Secondary | ICD-10-CM | POA: Insufficient documentation

## 2018-10-22 DIAGNOSIS — R7303 Prediabetes: Secondary | ICD-10-CM | POA: Diagnosis not present

## 2018-10-22 DIAGNOSIS — Z20828 Contact with and (suspected) exposure to other viral communicable diseases: Secondary | ICD-10-CM | POA: Diagnosis not present

## 2018-10-22 HISTORY — DX: Other specified postprocedural states: R11.2

## 2018-10-22 HISTORY — DX: Other specified postprocedural states: Z98.890

## 2018-10-22 LAB — BASIC METABOLIC PANEL
Anion gap: 6 (ref 5–15)
BUN: 11 mg/dL (ref 6–20)
CO2: 26 mmol/L (ref 22–32)
Calcium: 8.6 mg/dL — ABNORMAL LOW (ref 8.9–10.3)
Chloride: 107 mmol/L (ref 98–111)
Creatinine, Ser: 0.66 mg/dL (ref 0.44–1.00)
GFR calc Af Amer: >60 mL/min (ref >60)
GFR calc non Af Amer: >60 mL/min (ref >60)
Glucose, Bld: 75 mg/dL (ref 70–99)
Potassium: 3.7 mmol/L (ref 3.5–5.1)
Sodium: 139 mmol/L (ref 135–145)

## 2018-10-22 LAB — TYPE AND SCREEN
ABO/RH(D): B POS
Antibody Screen: NEGATIVE

## 2018-10-22 LAB — CBC
HCT: 39.8 % (ref 36.0–46.0)
Hemoglobin: 11.7 g/dL — ABNORMAL LOW (ref 12.0–15.0)
MCH: 20.9 pg — ABNORMAL LOW (ref 26.0–34.0)
MCHC: 29.4 g/dL — ABNORMAL LOW (ref 30.0–36.0)
MCV: 71.1 fL — ABNORMAL LOW (ref 80.0–100.0)
Platelets: 293 10*3/uL (ref 150–400)
RBC: 5.6 MIL/uL — ABNORMAL HIGH (ref 3.87–5.11)
RDW: 15.5 % (ref 11.5–15.5)
WBC: 5.3 10*3/uL (ref 4.0–10.5)
nRBC: 0 % (ref 0.0–0.2)

## 2018-10-22 LAB — SARS CORONAVIRUS 2 (TAT 6-24 HRS): SARS Coronavirus 2: NEGATIVE

## 2018-10-22 NOTE — Patient Instructions (Signed)
Your procedure is scheduled on: Monday 10/26/18 Report to [place]. To find out your arrival time please call [phone] between 1PM - 3PM on Friday 10/23/18.  Remember: Instructions that are not followed completely may result in serious medical risk, up to and including death, or upon the discretion of your surgeon and anesthesiologist your surgery may need to be rescheduled.     _X__ 1. Do not eat food after midnight the night before your procedure.                 No gum chewing or hard candies. You may drink clear liquids up to 2 hours                 before you are scheduled to arrive for your surgery- DO not drink clear                 liquids within 2 hours of the start of your surgery.                 Clear Liquids include:  water, apple juice without pulp, clear carbohydrate                 drink such as Clearfast or Gatorade, Black Coffee or Tea (Do not add                 anything to coffee or tea). Diabetics water only  __X__2.  On the morning of surgery brush your teeth with toothpaste and water, you                 may rinse your mouth with mouthwash if you wish.  Do not swallow any              toothpaste of mouthwash.     _X__ 3.  No Alcohol for 24 hours before or after surgery.   _X__ 4.  Do Not Smoke or use e-cigarettes For 24 Hours Prior to Your Surgery.                 Do not use any chewable tobacco products for at least 6 hours prior to                 surgery.  ____  5.  Bring all medications with you on the day of surgery if instructed.   __X__  6.  Notify your doctor if there is any change in your medical condition      (cold, fever, infections).     Do not wear jewelry, make-up, hairpins, clips or nail polish. Do not wear lotions, powders, or perfumes.  Do not shave 48 hours prior to surgery. Men may shave face and neck. Do not bring valuables to the hospital.    [facility] is not responsible for  any belongings or valuables.  Contacts, dentures/partials or body piercings may not be worn into surgery. Bring a case for your contacts, glasses or hearing aids, a denture cup will be supplied. Leave your suitcase in the car. After surgery it may be brought to your room. For patients admitted to the hospital, discharge time is determined by your treatment team.   Patients discharged the day of surgery will not be allowed to drive home.   Please read over the following fact sheets that you were given:   MRSA Information  __X__ Take these medicines the morning of surgery with A SIP OF WATER:  1. gabapentin (NEURONTIN)   2. May take prednisone, hydroxyzine and/or cetirizine if needed  3.   4.  5.  6.  ____ Fleet Enema (as directed)   __X__ Use CHG Soap/SAGE wipes as directed  ____ Use inhalers on the day of surgery  ____ Stop metformin/Janumet/Farxiga 2 days prior to surgery    ____ Take 1/2 of usual insulin dose the night before surgery. No insulin the morning          of surgery.   ____ Stop Blood Thinners Coumadin/Plavix/Xarelto/Pleta/Pradaxa/Eliquis/Effient/Aspirin  on   Or contact your Surgeon, Cardiologist or Medical Doctor regarding  ability to stop your blood thinners  __X__ Stop Anti-inflammatories 7 days before surgery such as Advil, Ibuprofen, Motrin,  BC or Goodies Powder, Naprosyn, Naproxen, Aleve, Aspirin   Hold Diclofenac until after surgery  __X__ Stop all herbal supplements, fish oil or vitamin E until after surgery.  You may continue your vitamins  ____ Bring C-Pap to the hospital.    Review Incentive Spirometry instructions. Drink Pre surgery carb drink 2 hours before arrival.

## 2018-10-26 ENCOUNTER — Encounter
Admission: AD | Disposition: A | Discharge status: Home / Self Care | Payer: Self-pay | Source: Home / Self Care | Attending: Obstetrics and Gynecology

## 2018-10-26 ENCOUNTER — Observation Stay
Admission: AD | Admit: 2018-10-26 | Discharge: 2018-10-27 | Disposition: A | Discharge status: Home / Self Care | Payer: 59 | Attending: Obstetrics and Gynecology | Admitting: Obstetrics and Gynecology

## 2018-10-26 ENCOUNTER — Other Ambulatory Visit: Payer: Self-pay

## 2018-10-26 ENCOUNTER — Ambulatory Visit: Payer: 59 | Admitting: Anesthesiology

## 2018-10-26 DIAGNOSIS — Z79899 Other long term (current) drug therapy: Secondary | ICD-10-CM | POA: Diagnosis not present

## 2018-10-26 DIAGNOSIS — I10 Essential (primary) hypertension: Secondary | ICD-10-CM | POA: Insufficient documentation

## 2018-10-26 DIAGNOSIS — Z9049 Acquired absence of other specified parts of digestive tract: Secondary | ICD-10-CM | POA: Insufficient documentation

## 2018-10-26 DIAGNOSIS — F329 Major depressive disorder, single episode, unspecified: Secondary | ICD-10-CM | POA: Insufficient documentation

## 2018-10-26 DIAGNOSIS — N83201 Unspecified ovarian cyst, right side: Secondary | ICD-10-CM | POA: Diagnosis not present

## 2018-10-26 DIAGNOSIS — Z7952 Long term (current) use of systemic steroids: Secondary | ICD-10-CM | POA: Insufficient documentation

## 2018-10-26 DIAGNOSIS — N858 Other specified noninflammatory disorders of uterus: Secondary | ICD-10-CM | POA: Diagnosis not present

## 2018-10-26 DIAGNOSIS — E119 Type 2 diabetes mellitus without complications: Secondary | ICD-10-CM | POA: Insufficient documentation

## 2018-10-26 DIAGNOSIS — N921 Excessive and frequent menstruation with irregular cycle: Principal | ICD-10-CM | POA: Insufficient documentation

## 2018-10-26 DIAGNOSIS — Z9889 Other specified postprocedural states: Secondary | ICD-10-CM

## 2018-10-26 DIAGNOSIS — Z791 Long term (current) use of non-steroidal anti-inflammatories (NSAID): Secondary | ICD-10-CM | POA: Diagnosis not present

## 2018-10-26 DIAGNOSIS — N926 Irregular menstruation, unspecified: Secondary | ICD-10-CM | POA: Diagnosis present

## 2018-10-26 HISTORY — PX: BILATERAL SALPINGECTOMY: SHX5743

## 2018-10-26 HISTORY — PX: VAGINAL HYSTERECTOMY: SHX2639

## 2018-10-26 LAB — ABO/RH: ABO/RH(D): B POS

## 2018-10-26 LAB — POCT PREGNANCY, URINE: Preg Test, Ur: NEGATIVE

## 2018-10-26 SURGERY — HYSTERECTOMY, VAGINAL
Anesthesia: General

## 2018-10-26 MED ORDER — CEFAZOLIN SODIUM-DEXTROSE 2-4 GM/100ML-% IV SOLN
INTRAVENOUS | Status: AC
  Filled 2018-10-26: qty 100

## 2018-10-26 MED ORDER — PHENYLEPHRINE HCL (PRESSORS) 10 MG/ML IV SOLN
INTRAVENOUS | Status: DC | PRN
  Administered 2018-10-26 (×2): 100 ug via INTRAVENOUS

## 2018-10-26 MED ORDER — LIDOCAINE-EPINEPHRINE 1 %-1:100000 IJ SOLN
INTRAMUSCULAR | Status: DC | PRN
  Administered 2018-10-26: 9 mL

## 2018-10-26 MED ORDER — ACETAMINOPHEN 500 MG PO TABS
ORAL_TABLET | ORAL | Status: AC
  Administered 2018-10-26: 1000 mg via ORAL
  Filled 2018-10-26: qty 2

## 2018-10-26 MED ORDER — PROPOFOL 10 MG/ML IV BOLUS
INTRAVENOUS | Status: AC
  Filled 2018-10-26: qty 20

## 2018-10-26 MED ORDER — ONDANSETRON HCL 4 MG/2ML IJ SOLN
INTRAMUSCULAR | Status: DC | PRN
  Administered 2018-10-26: 4 mg via INTRAVENOUS

## 2018-10-26 MED ORDER — OXYCODONE HCL 5 MG PO TABS: 5.0000 mg | ORAL_TABLET | Freq: Once | ORAL | Status: DC | PRN

## 2018-10-26 MED ORDER — FENTANYL CITRATE (PF) 100 MCG/2ML IJ SOLN: 25.0000 ug | INTRAMUSCULAR | Status: DC | PRN

## 2018-10-26 MED ORDER — MIDAZOLAM HCL 2 MG/2ML IJ SOLN
INTRAMUSCULAR | Status: AC
  Filled 2018-10-26: qty 2

## 2018-10-26 MED ORDER — MEPERIDINE HCL 50 MG/ML IJ SOLN
6.2500 mg | INTRAMUSCULAR | Status: DC | PRN
  Administered 2018-10-26: 12:00:00 12.5 mg via INTRAVENOUS

## 2018-10-26 MED ORDER — ONDANSETRON HCL 4 MG PO TABS: 4.0000 mg | ORAL_TABLET | ORAL | Status: DC | PRN

## 2018-10-26 MED ORDER — SUGAMMADEX SODIUM 200 MG/2ML IV SOLN
INTRAVENOUS | Status: DC | PRN
  Administered 2018-10-26: 160 mg via INTRAVENOUS

## 2018-10-26 MED ORDER — SCOPOLAMINE 1 MG/3DAYS TD PT72
1.0000 | MEDICATED_PATCH | Freq: Once | TRANSDERMAL | Status: DC
  Administered 2018-10-26: 08:00:00 1.5 mg via TRANSDERMAL

## 2018-10-26 MED ORDER — FAMOTIDINE 20 MG PO TABS
20.0000 mg | ORAL_TABLET | Freq: Once | ORAL | Status: AC
  Administered 2018-10-26: 08:00:00 20 mg via ORAL

## 2018-10-26 MED ORDER — DEXAMETHASONE SODIUM PHOSPHATE 10 MG/ML IJ SOLN
INTRAMUSCULAR | Status: AC
  Filled 2018-10-26: qty 1

## 2018-10-26 MED ORDER — MEPERIDINE HCL 50 MG/ML IJ SOLN
INTRAMUSCULAR | Status: AC
  Administered 2018-10-26: 12.5 mg via INTRAVENOUS
  Filled 2018-10-26: qty 1

## 2018-10-26 MED ORDER — ROCURONIUM BROMIDE 100 MG/10ML IV SOLN
INTRAVENOUS | Status: DC | PRN
  Administered 2018-10-26: 50 mg via INTRAVENOUS

## 2018-10-26 MED ORDER — FAMOTIDINE 20 MG PO TABS
ORAL_TABLET | ORAL | Status: AC
  Administered 2018-10-26: 20 mg via ORAL
  Filled 2018-10-26: qty 1

## 2018-10-26 MED ORDER — ONDANSETRON HCL 4 MG/2ML IJ SOLN
INTRAMUSCULAR | Status: AC
  Filled 2018-10-26: qty 2

## 2018-10-26 MED ORDER — SCOPOLAMINE 1 MG/3DAYS TD PT72
MEDICATED_PATCH | TRANSDERMAL | Status: AC
  Administered 2018-10-26: 1.5 mg via TRANSDERMAL
  Filled 2018-10-26: qty 1

## 2018-10-26 MED ORDER — MIDAZOLAM HCL 2 MG/2ML IJ SOLN
INTRAMUSCULAR | Status: DC | PRN
  Administered 2018-10-26: 2 mg via INTRAVENOUS

## 2018-10-26 MED ORDER — FENTANYL CITRATE (PF) 100 MCG/2ML IJ SOLN
INTRAMUSCULAR | Status: DC | PRN
  Administered 2018-10-26 (×2): 50 ug via INTRAVENOUS

## 2018-10-26 MED ORDER — LACTATED RINGERS IV SOLN
INTRAVENOUS | Status: DC
  Administered 2018-10-26: 13:00:00 via INTRAVENOUS

## 2018-10-26 MED ORDER — FENTANYL CITRATE (PF) 100 MCG/2ML IJ SOLN
INTRAMUSCULAR | Status: AC
  Filled 2018-10-26: qty 2

## 2018-10-26 MED ORDER — LIDOCAINE HCL (CARDIAC) PF 100 MG/5ML IV SOSY
PREFILLED_SYRINGE | INTRAVENOUS | Status: DC | PRN
  Administered 2018-10-26: 100 mg via INTRAVENOUS

## 2018-10-26 MED ORDER — GABAPENTIN 300 MG PO CAPS
300.0000 mg | ORAL_CAPSULE | Freq: Every day | ORAL | Status: DC
  Administered 2018-10-26: 300 mg via ORAL
  Filled 2018-10-26: qty 1

## 2018-10-26 MED ORDER — LACTATED RINGERS IV SOLN
INTRAVENOUS | Status: DC
  Administered 2018-10-26: 125 mL/h via INTRAVENOUS

## 2018-10-26 MED ORDER — ACETAMINOPHEN 500 MG PO TABS
1000.0000 mg | ORAL_TABLET | Freq: Four times a day (QID) | ORAL | Status: DC
  Administered 2018-10-26 – 2018-10-27 (×4): 1000 mg via ORAL
  Filled 2018-10-26 (×4): qty 2

## 2018-10-26 MED ORDER — SUGAMMADEX SODIUM 200 MG/2ML IV SOLN
INTRAVENOUS | Status: AC
  Filled 2018-10-26: qty 2

## 2018-10-26 MED ORDER — CEFAZOLIN SODIUM-DEXTROSE 2-4 GM/100ML-% IV SOLN
2.0000 g | Freq: Once | INTRAVENOUS | Status: AC
  Administered 2018-10-26: 2 g via INTRAVENOUS

## 2018-10-26 MED ORDER — LIDOCAINE-EPINEPHRINE 1 %-1:100000 IJ SOLN
INTRAMUSCULAR | Status: AC
  Filled 2018-10-26: qty 1

## 2018-10-26 MED ORDER — ACETAMINOPHEN 500 MG PO TABS
1000.0000 mg | ORAL_TABLET | ORAL | Status: AC
  Administered 2018-10-26: 08:00:00 1000 mg via ORAL

## 2018-10-26 MED ORDER — PROPOFOL 10 MG/ML IV BOLUS
INTRAVENOUS | Status: DC | PRN
  Administered 2018-10-26: 160 mg via INTRAVENOUS

## 2018-10-26 MED ORDER — PROMETHAZINE HCL 25 MG/ML IJ SOLN: 6.2500 mg | INTRAMUSCULAR | Status: DC | PRN

## 2018-10-26 MED ORDER — MEPERIDINE HCL 50 MG PO TABS
100.0000 mg | ORAL_TABLET | ORAL | Status: DC | PRN
  Administered 2018-10-26 – 2018-10-27 (×4): 100 mg via ORAL
  Filled 2018-10-26 (×5): qty 2

## 2018-10-26 MED ORDER — OXYCODONE HCL 5 MG/5ML PO SOLN: 5.0000 mg | Freq: Once | ORAL | Status: DC | PRN

## 2018-10-26 MED ORDER — KETOROLAC TROMETHAMINE 30 MG/ML IJ SOLN
INTRAMUSCULAR | Status: DC | PRN
  Administered 2018-10-26: 30 mg via INTRAVENOUS

## 2018-10-26 MED ORDER — GABAPENTIN 300 MG PO CAPS
300.0000 mg | ORAL_CAPSULE | ORAL | Status: AC
  Administered 2018-10-26: 08:00:00 300 mg via ORAL

## 2018-10-26 MED ORDER — ONDANSETRON HCL 4 MG/2ML IJ SOLN: 4.0000 mg | INTRAMUSCULAR | Status: DC | PRN

## 2018-10-26 MED ORDER — ROCURONIUM BROMIDE 50 MG/5ML IV SOLN
INTRAVENOUS | Status: AC
  Filled 2018-10-26: qty 1

## 2018-10-26 MED ORDER — DEXAMETHASONE SODIUM PHOSPHATE 10 MG/ML IJ SOLN
INTRAMUSCULAR | Status: DC | PRN
  Administered 2018-10-26: 10 mg via INTRAVENOUS

## 2018-10-26 MED ORDER — GABAPENTIN 300 MG PO CAPS
ORAL_CAPSULE | ORAL | Status: AC
  Administered 2018-10-26: 300 mg via ORAL
  Filled 2018-10-26: qty 1

## 2018-10-26 MED ORDER — LIDOCAINE HCL (PF) 2 % IJ SOLN
INTRAMUSCULAR | Status: AC
  Filled 2018-10-26: qty 10

## 2018-10-26 SURGICAL SUPPLY — 34 items
BAG URINE DRAINAGE (UROLOGICAL SUPPLIES) ×4 IMPLANT
CANISTER SUCT 1200ML W/VALVE (MISCELLANEOUS) ×4 IMPLANT
CATH FOLEY 2WAY  5CC 16FR (CATHETERS) ×2
CATH ROBINSON RED A/P 16FR (CATHETERS) ×4 IMPLANT
CATH URTH 16FR FL 2W BLN LF (CATHETERS) ×2 IMPLANT
COVER WAND RF STERILE (DRAPES) ×4 IMPLANT
DRAPE PERI LITHO V/GYN (MISCELLANEOUS) ×4 IMPLANT
DRAPE SURG 17X11 SM STRL (DRAPES) ×4 IMPLANT
DRAPE UNDER BUTTOCK W/FLU (DRAPES) ×4 IMPLANT
ELECT REM PT RETURN 9FT ADLT (ELECTROSURGICAL) ×4
ELECTRODE REM PT RTRN 9FT ADLT (ELECTROSURGICAL) ×2 IMPLANT
GLOVE BIO SURGEON STRL SZ8 (GLOVE) ×8 IMPLANT
GOWN STRL REUS W/ TWL LRG LVL3 (GOWN DISPOSABLE) ×6 IMPLANT
GOWN STRL REUS W/ TWL XL LVL3 (GOWN DISPOSABLE) ×2 IMPLANT
GOWN STRL REUS W/TWL LRG LVL3 (GOWN DISPOSABLE) ×6
GOWN STRL REUS W/TWL XL LVL3 (GOWN DISPOSABLE) ×2
KIT TURNOVER CYSTO (KITS) ×4 IMPLANT
LABEL OR SOLS (LABEL) ×4 IMPLANT
NEEDLE HYPO 22GX1.5 SAFETY (NEEDLE) ×4 IMPLANT
PACK BASIN MINOR ARMC (MISCELLANEOUS) ×4 IMPLANT
PAD OB MATERNITY 4.3X12.25 (PERSONAL CARE ITEMS) ×4 IMPLANT
PAD PREP 24X41 OB/GYN DISP (PERSONAL CARE ITEMS) ×4 IMPLANT
SOL PREP PVP 2OZ (MISCELLANEOUS) ×4
SOLUTION PREP PVP 2OZ (MISCELLANEOUS) ×2 IMPLANT
SURGILUBE 2OZ TUBE FLIPTOP (MISCELLANEOUS) ×4 IMPLANT
SUT PDS 2-0 27IN (SUTURE) ×4 IMPLANT
SUT VIC AB 0 CT1 27 (SUTURE) ×4
SUT VIC AB 0 CT1 27XCR 8 STRN (SUTURE) ×4 IMPLANT
SUT VIC AB 0 CT1 36 (SUTURE) ×8 IMPLANT
SUT VIC AB 2-0 SH 27 (SUTURE) ×6
SUT VIC AB 2-0 SH 27XBRD (SUTURE) ×6 IMPLANT
SYR 10ML LL (SYRINGE) ×4 IMPLANT
SYR CONTROL 10ML LL (SYRINGE) ×4 IMPLANT
WATER STERILE IRR 1000ML POUR (IV SOLUTION) ×4 IMPLANT

## 2018-10-26 NOTE — Progress Notes (Signed)
Patient ID: Alicia Vincent, female   DOB: 1976/01/11, 44 y.o.   MRN: [id number] DOS TVH . PAin adequately controlled .  PO intake adequate  UO good  Cont with IVF and po meds   encourage IS tonight

## 2018-10-26 NOTE — Anesthesia Postprocedure Evaluation (Signed)
Anesthesia Post Note  Patient: Alicia Vincent  Procedure(s) Performed: HYSTERECTOMY VAGINAL (N/A ) BILATERAL SALPINGECTOMY (Bilateral )  Patient location during evaluation: PACU Anesthesia Type: General Level of consciousness: awake and alert and oriented Pain management: pain level controlled Vital Signs Assessment: post-procedure vital signs reviewed and stable Respiratory status: spontaneous breathing, nonlabored ventilation and respiratory function stable Cardiovascular status: blood pressure returned to baseline and stable Postop Assessment: no signs of nausea or vomiting Anesthetic complications: no     Last Vitals:  Vitals:   10/26/18 1159 10/26/18 1202  BP: 112/70   Pulse: 69 69  Resp: 13 (!) 22  Temp: 37.2 C   SpO2: 98% 99%    Last Pain:  Vitals:   10/26/18 1159  TempSrc:   PainSc: 0-No pain                 Jaymason Ledesma

## 2018-10-26 NOTE — Progress Notes (Signed)
Labs reviewed . NPO . Covid neg , hcg neg  Scheduled for TVH and bilateral salpingectomy  All questions answered - proceed

## 2018-10-26 NOTE — Anesthesia Procedure Notes (Signed)
Procedure Name: Intubation Date/Time: 10/26/2018 9:58 AM Performed by: Aline Brochure, CRNA Pre-anesthesia Checklist: Patient identified, Emergency Drugs available, Suction available and Patient being monitored Patient Re-evaluated:Patient Re-evaluated prior to induction Oxygen Delivery Method: Circle system utilized Preoxygenation: Pre-oxygenation with 100% oxygen Induction Type: IV induction Ventilation: Mask ventilation without difficulty Laryngoscope Size: McGraph and 3 Grade View: Grade I Tube type: Oral Tube size: 7.0 mm Number of attempts: 1 Airway Equipment and Method: Stylet and Video-laryngoscopy Placement Confirmation: ETT inserted through vocal cords under direct vision,  positive ETCO2 and breath sounds checked- equal and bilateral Secured at: 20 cm Tube secured with: Tape Dental Injury: Teeth and Oropharynx as per pre-operative assessment

## 2018-10-26 NOTE — Op Note (Signed)
NAME: Alicia Vincent, CIFELLI MEDICAL RECORD ZW:[id number] ACCOUNT [id number] DATE OF BIRTH:06-05-1975 FACILITY: [facility] LOCATION: [facility] PHYSICIAN:THOMAS Josefine Class, MD  OPERATIVE REPORT  DATE OF PROCEDURE:  10/26/2018  PREOPERATIVE DIAGNOSES:  Menorrhagia.  POSTOPERATIVE DIAGNOSES:  Menorrhagia.  PROCEDURE: 1.  Total vaginal hysterectomy. 2.  Bilateral salpingectomy. 3.  Right ovarian cystotomy.  ANESTHESIA:  General endotracheal anesthesia.  SURGEON:  Laverta Baltimore, MD  FIRST ASSISTANT:  Benjaman Kindler, MD  INDICATION:  This is a 43 year old gravida 1, para 1 patient with a long history of irregular menstrual cycles and prolonged Depo-Provera use.  The patient opts for definitive therapy.  DESCRIPTION OF PROCEDURE:  After adequate general endotracheal anesthesia, the patient was placed in dorsal supine position, legs in the candy cane stirrups.  The patient's perineum and vagina were prepped and draped in normal sterile fashion.  Timeout  was performed.  The patient did receive 2 g IV Ancef prior to commencement of the case.  A weighted speculum was placed in the posterior vaginal vault, and the cervix was grasped with 2 thyroid tenacula.  Cervix was circumferentially injected with 1%  lidocaine with 1:100,000 epinephrine.  A direct posterior colpotomy incision was made without difficulty, and a weighted speculum was placed into the posterior cul-de-sac.  The uterosacral ligaments were bilaterally clamped, transected, suture ligated  with 0 Vicryl suture, and tagged for later identification.  The anterior cervix was circumferentially incised with the Bovie.  Anterior cul-de-sac was entered without difficulty.  The cardinal ligaments were then bilaterally clamped, transected, suture  ligated with 0 Vicryl suture, followed by clamping of the uterine arteries bilaterally and transecting and suture ligating with 0 Vicryl suture.  The cornua were bilaterally clamped,  transected, and ligated with 0 Vicryl suture and the uterus was  delivered.  The patient had a 4 x 3 right ovarian cyst that appeared simple in nature.  A direct cystotomy was made with the Bovie with clear fluid resulting.  Each fallopian tube was grasped with a Babcock clamp and clamped with the Heaney-Ballantine  clamp, and the distal portion of fallopian tube was removed and each pedicle was ligated with 0 Vicryl suture.  A pursestring suture of the peritoneum was then performed with 2-0 PDS, and the vaginal cuff was then closed with a running 0 Vicryl suture.   Good approximation of tissues.  The uterosacral ligaments were plicated centrally, and the rest of the vaginal vault was closed.  Of note, the bladder was drained prior to commencement of the case, yielding 100 mL of urine.  A Foley catheter was placed  in the end of the case, yielding an additional 50 mL of urine.  COMPLICATIONS:  There were no complications.  ESTIMATED BLOOD LOSS:  10 mL.  INTRAOPERATIVE FLUIDS:  1000 mL.  DISPOSITION:  The patient was taken to recovery room in good condition.  LN/NUANCE  D:10/26/2018 T:10/26/2018 JOB:[id number]

## 2018-10-26 NOTE — Anesthesia Preprocedure Evaluation (Signed)
Anesthesia Evaluation  Patient identified by MRN, date of birth, ID band Patient awake    Reviewed: Allergy & Precautions, NPO status , Patient's Chart, lab work & pertinent test results  History of Anesthesia Complications (+) PONV and history of anesthetic complications  Airway Mallampati: II  TM Distance: >3 FB Neck ROM: Full    Dental no notable dental hx.    Pulmonary neg pulmonary ROS, neg sleep apnea, neg COPD,    breath sounds clear to auscultation- rhonchi (-) wheezing      Cardiovascular hypertension, Pt. on medications (-) CAD, (-) Past MI, (-) Cardiac Stents and (-) CABG  Rhythm:Regular Rate:Normal - Systolic murmurs and - Diastolic murmurs    Neuro/Psych neg Seizures negative neurological ROS  negative psych ROS   GI/Hepatic negative GI ROS, Neg liver ROS,   Endo/Other  negative endocrine ROSneg diabetes  Renal/GU negative Renal ROS     Musculoskeletal negative musculoskeletal ROS (+)   Abdominal (+) + obese,   Peds  Hematology negative hematology ROS (+)   Anesthesia Other Findings Past Medical History: No date: Allergy No date: Hypertension No date: PONV (postoperative nausea and vomiting) No date: Pre-diabetes No date: Thyroid disease   Reproductive/Obstetrics                             Anesthesia Physical Anesthesia Plan  ASA: II  Anesthesia Plan: General   Post-op Pain Management:    Induction: Intravenous  PONV Risk Score and Plan: 3 and Ondansetron, Dexamethasone, Midazolam and Scopolamine patch - Pre-op  Airway Management Planned: Oral ETT  Additional Equipment:   Intra-op Plan:   Post-operative Plan: Extubation in OR  Informed Consent: I have reviewed the patients History and Physical, chart, labs and discussed the procedure including the risks, benefits and alternatives for the proposed anesthesia with the patient or authorized representative who  has indicated his/her understanding and acceptance.     Dental advisory given  Plan Discussed with: CRNA and Anesthesiologist  Anesthesia Plan Comments:         Anesthesia Quick Evaluation

## 2018-10-26 NOTE — Brief Op Note (Signed)
10/26/2018  11:10 AM  PATIENT:  Alicia Vincent  43 y.o. female  PRE-OPERATIVE DIAGNOSIS:  menorrhagia  POST-OPERATIVE DIAGNOSIS:  menorrhagia  PROCEDURE:  Procedure(s): HYSTERECTOMY VAGINAL (N/A) BILATERAL SALPINGECTOMY (Bilateral) Right ovarian cystotomy  SURGEON:  Surgeon(s) and Role:    * Mylinh Cragg, Gwen Her, MD - Primary    * Benjaman Kindler, MD - Assisting  PHYSICIAN ASSISTANT:   ASSISTANTS: none   ANESTHESIA:   general  EBL:  10 cc BLOOD ADMINISTERED:none  DRAINS: Urinary Catheter (Foley)   LOCAL MEDICATIONS USED:  LIDOCAINE   SPECIMEN:  Source of Specimen:  cervix , uterus and bilateral fallopian tubes  DISPOSITION OF SPECIMEN:  PATHOLOGY  COUNTS:  YES  TOURNIQUET:  * No tourniquets in log *  DICTATION: .Other Dictation: Dictation Number verbal  PLAN OF CARE: Admit for overnight observation  PATIENT DISPOSITION:  PACU - hemodynamically stable.   Delay start of Pharmacological VTE agent (>24hrs) due to surgical blood loss or risk of bleeding: not applicable

## 2018-10-26 NOTE — Anesthesia Post-op Follow-up Note (Signed)
Anesthesia QCDR form completed.        

## 2018-10-26 NOTE — Transfer of Care (Signed)
Immediate Anesthesia Transfer of Care Note  Patient: Alicia Vincent  Procedure(s) Performed: HYSTERECTOMY VAGINAL (N/A ) BILATERAL SALPINGECTOMY (Bilateral )  Patient Location: PACU  Anesthesia Type:General  Level of Consciousness: awake and alert   Airway & Oxygen Therapy: Patient connected to face mask oxygen  Post-op Assessment: Post -op Vital signs reviewed and stable  Post vital signs: stable  Last Vitals:  Vitals Value Taken Time  BP 136/91 10/26/18 1128  Temp 36.7 C 10/26/18 1128  Pulse 78 10/26/18 1130  Resp 20 10/26/18 1130  SpO2 100 % 10/26/18 1130  Vitals shown include unvalidated device data.  Last Pain:  Vitals:   10/26/18 1128  TempSrc: Temporal      Patients Stated Pain Goal: 0 (11/13/11 1438)  Complications: No apparent anesthesia complications

## 2018-10-27 ENCOUNTER — Encounter: Payer: Self-pay | Admitting: Obstetrics and Gynecology

## 2018-10-27 DIAGNOSIS — N921 Excessive and frequent menstruation with irregular cycle: Secondary | ICD-10-CM | POA: Diagnosis not present

## 2018-10-27 LAB — BASIC METABOLIC PANEL
Anion gap: 5 (ref 5–15)
BUN: 15 mg/dL (ref 6–20)
CO2: 24 mmol/L (ref 22–32)
Calcium: 8.4 mg/dL — ABNORMAL LOW (ref 8.9–10.3)
Chloride: 107 mmol/L (ref 98–111)
Creatinine, Ser: 0.78 mg/dL (ref 0.44–1.00)
GFR calc Af Amer: >60 mL/min (ref >60)
GFR calc non Af Amer: >60 mL/min (ref >60)
Glucose, Bld: 119 mg/dL — ABNORMAL HIGH (ref 70–99)
Potassium: 4.5 mmol/L (ref 3.5–5.1)
Sodium: 136 mmol/L (ref 135–145)

## 2018-10-27 LAB — CBC
HCT: 38.4 % (ref 36.0–46.0)
Hemoglobin: 11.4 g/dL — ABNORMAL LOW (ref 12.0–15.0)
MCH: 20.9 pg — ABNORMAL LOW (ref 26.0–34.0)
MCHC: 29.7 g/dL — ABNORMAL LOW (ref 30.0–36.0)
MCV: 70.3 fL — ABNORMAL LOW (ref 80.0–100.0)
Platelets: 278 10*3/uL (ref 150–400)
RBC: 5.46 MIL/uL — ABNORMAL HIGH (ref 3.87–5.11)
RDW: 15.7 % — ABNORMAL HIGH (ref 11.5–15.5)
WBC: 11.7 10*3/uL — ABNORMAL HIGH (ref 4.0–10.5)
nRBC: 0 % (ref 0.0–0.2)

## 2018-10-27 LAB — SURGICAL PATHOLOGY

## 2018-10-27 MED ORDER — MEPERIDINE HCL 50 MG PO TABS: ORAL_TABLET | ORAL | 0 refills | Status: AC

## 2018-10-27 MED ORDER — ONDANSETRON HCL 4 MG PO TABS: 4.0000 mg | ORAL_TABLET | ORAL | 0 refills | Status: AC | PRN

## 2018-10-27 MED ORDER — DOCUSATE SODIUM 100 MG PO CAPS: 100.0000 mg | ORAL_CAPSULE | Freq: Every day | ORAL | 2 refills | Status: AC | PRN

## 2018-10-27 MED ORDER — GABAPENTIN 300 MG PO CAPS: 300.0000 mg | ORAL_CAPSULE | Freq: Every day | ORAL | 1 refills | Status: AC

## 2018-10-27 NOTE — Discharge Summary (Signed)
Physician Discharge Summary  Patient ID: Alicia Vincent MRN: [id number] DOB/AGE: 45-May-1977 45 y.o.  Admit date: 10/26/2018 Discharge date: 10/27/2018  Admission Diagnoses:menorrhagia  Discharge Diagnoses: s/p TVH and bilateral salpingectomy  Active Problems:   Post-operative state   Discharged Condition: good  Hospital Course: uncomplicate surgery and post op   Consults: None  Significant Diagnostic Studies: labs:  Results for orders placed or performed during the hospital encounter of 10/26/18 (from the past 72 hour(s))  Pregnancy, urine POC     Status: None   Collection Time: 10/26/18  7:49 AM  Result Value Ref Range   Preg Test, Ur NEGATIVE NEGATIVE    Comment:        THE SENSITIVITY OF THIS METHODOLOGY IS >24 mIU/mL   ABO/Rh     Status: None   Collection Time: 10/26/18  8:16 AM  Result Value Ref Range   ABO/RH(D)      B POS Performed at [facility], [address] [place], [place] [address]   CBC     Status: Abnormal   Collection Time: 10/27/18  5:38 AM  Result Value Ref Range   WBC 11.7 (H) 4.0 - 10.5 K/uL   RBC 5.46 (H) 3.87 - 5.11 MIL/uL   Hemoglobin 11.4 (L) 12.0 - 15.0 g/dL   HCT 00.9 38.1 - 82.9 %   MCV 70.3 (L) 80.0 - 100.0 fL   MCH 20.9 (L) 26.0 - 34.0 pg   MCHC 29.7 (L) 30.0 - 36.0 g/dL   RDW 93.7 (H) 16.9 - 67.8 %   Platelets 278 150 - 400 K/uL   nRBC 0.0 0.0 - 0.2 %    Comment: Performed at [facility], [address] [place], [place] [address]  Basic metabolic panel     Status: Abnormal   Collection Time: 10/27/18  5:38 AM  Result Value Ref Range   Sodium 136 135 - 145 mmol/L   Potassium 4.5 3.5 - 5.1 mmol/L   Chloride 107 98 - 111 mmol/L   CO2 24 22 - 32 mmol/L   Glucose, Bld 119 (H) 70 - 99 mg/dL   BUN 15 6 - 20 mg/dL   Creatinine, Ser 1.75 0.44 - 1.00 mg/dL   Calcium 8.4 (L) 8.9 - 10.3 mg/dL   GFR calc non Af Amer >60 >60 mL/min   GFR calc Af Amer >60 >60 mL/min   Anion gap 5 5 - 15    Comment: Performed at  [facility], [address] [place], [place] [address]    Treatments: surgery: as above  Discharge Exam: Blood pressure 103/60, pulse 63, temperature 98.2 F (36.8 C), temperature source Oral, resp. rate 18, height 5\' 1"  (1.549 m), weight 79.8 kg, SpO2 97 %. General appearance: alert and cooperative Resp: clear to auscultation bilaterally Cardio: regular rate and rhythm, S1, S2 normal, no murmur, click, rub or gallop GI: soft, non-tender; bowel sounds normal; no masses,  no organomegaly  Disposition: Discharge disposition: 01-Home or Self Care       Discharge Instructions    Call MD for:   Complete by: As directed    Heavy vaginal bleeding   Call MD for:  difficulty breathing, headache or visual disturbances   Complete by: As directed    Call MD for:  extreme fatigue   Complete by: As directed    Call MD for:  hives   Complete by: As directed    Call MD for:  persistant dizziness or light-headedness   Complete  by: As directed    Call MD for:  persistant nausea and vomiting   Complete by: As directed    Call MD for:  redness, tenderness, or signs of infection (pain, swelling, redness, odor or green/yellow discharge around incision site)   Complete by: As directed    Call MD for:  severe uncontrolled pain   Complete by: As directed    Call MD for:  temperature >100.4   Complete by: As directed    Diet - low sodium heart healthy   Complete by: As directed    Increase activity slowly   Complete by: As directed      Allergies as of 10/27/2018      Reactions   Shellfish-derived Products Hives, Shortness Of Breath   Nsaids Other (See Comments)   Gastric sleeve patient   Oxycodone Hives, Itching      Medication List    TAKE these medications   acetaminophen 650 MG CR tablet Commonly known as: TYLENOL Take 650 mg by mouth every 8 (eight) hours as needed for pain.   amitriptyline 50 MG tablet Commonly known as: ELAVIL Take 50 mg by mouth at bedtime.    cetirizine 10 MG tablet Commonly known as: ZYRTEC Take 10 mg by mouth daily as needed for allergies.   diclofenac 50 MG EC tablet Commonly known as: VOLTAREN Take 50 mg by mouth 2 (two) times daily as needed (pain (take with a meal)).   docusate sodium 100 MG capsule Commonly known as: Colace Take 1 capsule (100 mg total) by mouth daily as needed.   EPINEPHrine 0.3 mg/0.3 mL Soaj injection Commonly known as: EPI-PEN Inject 0.3 mg into the muscle as needed for anaphylaxis. Frequency:PHARMDIR   Dosage:0.0     Instructions:  Note:Inject into lateral thigh according to package instructions as needed for severe allergic reactions, then go immediately to the nearest Emergency Department Dose: 0.3MG /0.3   fluticasone 50 MCG/ACT nasal spray Commonly known as: FLONASE Place 1 spray into both nostrils daily as needed (allergies).   folic acid 1 MG tablet Commonly known as: FOLVITE Take 1 mg by mouth See admin instructions. Take 1 tablet (1 mg) by mouth daily in the morning, except on Sundays.   gabapentin 100 MG capsule Commonly known as: NEURONTIN Take 100 mg by mouth 3 (three) times daily. What changed: Another medication with the same name was added. Make sure you understand how and when to take each.   gabapentin 300 MG capsule Commonly known as: NEURONTIN Take 1 capsule (300 mg total) by mouth at bedtime. What changed: You were already taking a medication with the same name, and this prescription was added. Make sure you understand how and when to take each.   hydrOXYzine 25 MG tablet Commonly known as: ATARAX/VISTARIL Take 25 mg by mouth 3 (three) times daily as needed for itching.   meperidine 50 MG tablet Commonly known as: DEMEROL 1-2 po q 4-6 hours prn pain   methotrexate 50 MG/2ML injection Inject 25 mg into the muscle every Sunday.   ondansetron 4 MG tablet Commonly known as: ZOFRAN Take 1 tablet (4 mg total) by mouth every 4 (four) hours as needed for nausea or  vomiting.   Orencia 125 MG/ML Sosy Generic drug: Abatacept Inject 125 mg into the skin every Sunday.   polyethylene glycol 17 g packet Commonly known as: MIRALAX / GLYCOLAX Take 17 g by mouth daily as needed (constipation.).   predniSONE 5 MG tablet Commonly known as: DELTASONE Take 5  mg by mouth daily as needed (arthritis/inflammation or swelling).   traMADol 50 MG tablet Commonly known as: ULTRAM Take 50 mg by mouth every 12 (twelve) hours as needed (pain.).   vitamin B-12 500 MCG tablet Commonly known as: CYANOCOBALAMIN Take 500 mcg by mouth daily.      Follow-up Information    Schermerhorn, Ihor Austinhomas J, MD Follow up in 2 week(s).   Specialty: Obstetrics and Gynecology Why: video visit  Contact information: [address] [place] [facility] [place] [id number] [phone]           Signed: Ihor Austinhomas J Schermerhorn 10/27/2018, 8:39 AM

## 2018-10-27 NOTE — Discharge Instructions (Signed)
Call MD for temp > 100.4, heavy vaginal bleeding, SOB or any issues from pain meds  No vaginal contact for 6 wks

## 2018-10-27 NOTE — Progress Notes (Signed)
DC instr reviewed with pt.  Verb u/o of f/u virtual visit appointment and pt has already made this appointment.  Reviewed dc meds and pt feels comfortable with her meds.

## 2018-10-27 NOTE — Progress Notes (Signed)
DC to home to car via wc with staff.

## 2019-05-15 ENCOUNTER — Ambulatory Visit: Payer: 59 | Attending: Internal Medicine
